# Patient Record
Sex: Male | Born: 1995 | Race: Black or African American | Hispanic: No | Marital: Single | Smoking: Current some day smoker
Health system: Southern US, Community
[De-identification: ages and names within clinical notes are randomized; demographics above are authoritative.]

## PROBLEM LIST (undated history)

## (undated) DIAGNOSIS — J45909 Unspecified asthma, uncomplicated: Secondary | ICD-10-CM

---

## 2015-05-04 ENCOUNTER — Encounter (HOSPITAL_COMMUNITY): Payer: Self-pay | Admitting: Oncology

## 2015-05-04 ENCOUNTER — Emergency Department (HOSPITAL_COMMUNITY)
Admission: EM | Admit: 2015-05-04 | Discharge: 2015-05-04 | Disposition: A | Discharge status: Home / Self Care | Payer: Self-pay | Attending: Emergency Medicine | Admitting: Emergency Medicine

## 2015-05-04 DIAGNOSIS — J069 Acute upper respiratory infection, unspecified: Secondary | ICD-10-CM | POA: Insufficient documentation

## 2015-05-04 DIAGNOSIS — J45901 Unspecified asthma with (acute) exacerbation: Secondary | ICD-10-CM | POA: Insufficient documentation

## 2015-05-04 DIAGNOSIS — Z8739 Personal history of other diseases of the musculoskeletal system and connective tissue: Secondary | ICD-10-CM | POA: Insufficient documentation

## 2015-05-04 DIAGNOSIS — F17200 Nicotine dependence, unspecified, uncomplicated: Secondary | ICD-10-CM | POA: Insufficient documentation

## 2015-05-04 MED ORDER — ALBUTEROL SULFATE HFA 108 (90 BASE) MCG/ACT IN AERS
4.0000 | INHALATION_SPRAY | Freq: Once | RESPIRATORY_TRACT | Status: AC
  Administered 2015-05-04: 4 via RESPIRATORY_TRACT
  Filled 2015-05-04: qty 6.7

## 2015-05-04 MED ORDER — ALBUTEROL SULFATE (2.5 MG/3ML) 0.083% IN NEBU
5.0000 mg | INHALATION_SOLUTION | Freq: Once | RESPIRATORY_TRACT | Status: AC
  Administered 2015-05-04: 5 mg via RESPIRATORY_TRACT
  Filled 2015-05-04: qty 6

## 2015-05-04 MED ORDER — PREDNISONE 20 MG PO TABS: 60.0000 mg | ORAL_TABLET | Freq: Every day | ORAL | Status: DC

## 2015-05-04 MED ORDER — PREDNISONE 20 MG PO TABS
60.0000 mg | ORAL_TABLET | Freq: Once | ORAL | Status: AC
  Administered 2015-05-04: 60 mg via ORAL
  Filled 2015-05-04: qty 3

## 2015-05-04 MED ORDER — ALBUTEROL SULFATE HFA 108 (90 BASE) MCG/ACT IN AERS
2.0000 | INHALATION_SPRAY | RESPIRATORY_TRACT | Status: AC | PRN
Start: 2015-05-04 — End: ?

## 2015-05-04 MED ORDER — BENZONATATE 100 MG PO CAPS: 100.0000 mg | ORAL_CAPSULE | Freq: Three times a day (TID) | ORAL | Status: DC | PRN

## 2015-05-04 NOTE — ED Provider Notes (Signed)
By signing my name below, I, Ronney LionSuzanne Le, attest that this documentation has been prepared under the direction and in the presence of Sherlyne Crownover N Novella Abraha, DO. Electronically Signed: Ronney LionSuzanne Le, ED Scribe. 05/04/2015. 4:39 AM.   TIME SEEN: 4:33 AM   CHIEF COMPLAINT: Wheezing  HPI:  Elby ShowersShyheim Cox is a 19 y.o. male with a history of asthma, who presents to the Emergency Department complaining of constant wheezing that began this morning. Patient has a history of asthma and reports he had caught a URI, with cough, rhinorrhea, and sore throat, which seemed to exacerbate his asthma. He states he had taken old prednisone pills yesterday from a prior prescription for his symptoms. Patient had a nebulizer treatment here which provided significant relief to his symptoms. He is unaware of any fever.    ROS: See HPI Constitutional: no fever  Eyes: no drainage  ENT: runny nose   Cardiovascular:  no chest pain  Resp: no SOB  GI: no vomiting GU: no dysuria Integumentary: no rash  Allergy: no hives  Musculoskeletal: no leg swelling  Neurological: no slurred speech ROS otherwise negative  PAST MEDICAL HISTORY/PAST SURGICAL HISTORY:  Past Medical History  Diagnosis Date  . Arthritis     MEDICATIONS:  Prior to Admission medications   Not on File    ALLERGIES:  No Known Allergies  SOCIAL HISTORY:  Social History  Substance Use Topics  . Smoking status: Current Some Day Smoker  . Smokeless tobacco: Never Used  . Alcohol Use: Yes    FAMILY HISTORY: No family history on file.  EXAM: BP 145/82 mmHg  Pulse 69  Temp(Src) 98 F (36.7 C) (Oral)  Resp 20  Ht 5\' 8"  (1.727 m)  Wt 150 lb (68.04 kg)  BMI 22.81 kg/m2  SpO2 99% CONSTITUTIONAL: Alert and oriented and responds appropriately to questions. Well-appearing; well-nourished, afebrile, smiling, joking, in no distress HEAD: Normocephalic EYES: Conjunctivae clear, PERRL ENT: normal nose; no rhinorrhea; moist mucous membranes; pharynx  without lesions noted NECK: Supple, no meningismus, no LAD  CARD: RRR; S1 and S2 appreciated; no murmurs, no clicks, no rubs, no gallops RESP: Normal chest excursion without splinting or tachypnea; breath sounds clear and equal bilaterally; no wheezes, no rhonchi, no rales, no hypoxia or respiratory distress, speaking full sentences ABD/GI: Normal bowel sounds; non-distended; soft, non-tender, no rebound, no guarding, no peritoneal signs BACK:  The back appears normal and is non-tender to palpation, there is no CVA tenderness EXT: Normal ROM in all joints; non-tender to palpation; no edema; normal capillary refill; no cyanosis, no calf tenderness or swelling    SKIN: Normal color for age and race; warm NEURO: Moves all extremities equally, sensation to light touch intact diffusely, cranial nerves II through XII intact PSYCH: The patient's mood and manner are appropriate. Grooming and personal hygiene are appropriate.  MEDICAL DECISION MAKING: Patient here with asthma exacerbation from URI. Patient's father has similar symptoms. He was given albuterol in triage and reports feeling much better and his lungs are clear. We'll discharge with prednisone burst and albuterol inhaler. Given he is not asymptomatic and do not feel he needs a chest x-ray. His lungs are clear and he has no fever. Doubt pneumonia. Suspect this is a viral illness exacerbating his asthma. Discussed return precautions. Patient verbalizes understanding and is comfortable with this plan.  I personally performed the services described in this documentation, which was scribed in my presence. The recorded information has been reviewed and is accurate.   Layla MawKristen N Zayanna Pundt,  DO 05/04/15 1610

## 2015-05-04 NOTE — ED Notes (Signed)
Prednisone and inhaler entered into the wrong pt's chart per Dr. Elesa MassedWard.  Dr. Elesa MassedWard asked this writer to d/c prednisone and inhaler on that pt and order it for this pt.  Pt had been d/c'd prior to discovering that order was on wrong pt.  Dale Cox d/c was undone in order to administer the prednisone and inhaler that had been meant for him.

## 2015-05-04 NOTE — ED Notes (Signed)
Pt c/o asthma exacerbation.  Recent contact w/ friend that has URI.  Pt is speaking in full sentences.  Used his rescue inhaler PTA however it did not help.

## 2015-05-04 NOTE — Discharge Instructions (Signed)
Asthma, Acute Bronchospasm  Acute bronchospasm caused by asthma is also referred to as an asthma attack. Bronchospasm means your air passages become narrowed. The narrowing is caused by inflammation and tightening of the muscles in the air tubes (bronchi) in your lungs. This can make it hard to breathe or cause you to wheeze and cough.  CAUSES  Possible triggers are:   Animal dander from the skin, hair, or feathers of animals.   Dust mites contained in house dust.   Cockroaches.   Pollen from trees or grass.   Mold.   Cigarette or tobacco smoke.   Air pollutants such as dust, household cleaners, hair sprays, aerosol sprays, paint fumes, strong chemicals, or strong odors.   Cold air or weather changes. Cold air may trigger inflammation. Winds increase molds and pollens in the air.   Strong emotions such as crying or laughing hard.   Stress.   Certain medicines such as aspirin or beta-blockers.   Sulfites in foods and drinks, such as dried fruits and wine.   Infections or inflammatory conditions, such as a flu, cold, or inflammation of the nasal membranes (rhinitis).   Gastroesophageal reflux disease (GERD). GERD is a condition where stomach acid backs up into your esophagus.   Exercise or strenuous activity.  SIGNS AND SYMPTOMS    Wheezing.   Excessive coughing, particularly at night.   Chest tightness.   Shortness of breath.  DIAGNOSIS   Your health care provider will ask you about your medical history and perform a physical exam. A chest X-ray or blood testing may be performed to look for other causes of your symptoms or other conditions that may have triggered your asthma attack.  TREATMENT   Treatment is aimed at reducing inflammation and opening up the airways in your lungs. Most asthma attacks are treated with inhaled medicines. These include quick relief or rescue medicines (such as bronchodilators) and controller medicines (such as inhaled corticosteroids). These medicines are sometimes  given through an inhaler or a nebulizer. Systemic steroid medicine taken by mouth or given through an IV tube also can be used to reduce the inflammation when an attack is moderate or severe. Antibiotic medicines are only used if a bacterial infection is present.   HOME CARE INSTRUCTIONS    Rest.   Drink plenty of liquids. This helps the mucus to remain thin and be easily coughed up. Only use caffeine in moderation and do not use alcohol until you have recovered from your illness.   Do not smoke. Avoid being exposed to secondhand smoke.   You play a critical role in keeping yourself in good health. Avoid exposure to things that cause you to wheeze or to have breathing problems.   Keep your medicines up-to-date and available. Carefully follow your health care provider's treatment plan.   Take your medicine exactly as prescribed.   When pollen or pollution is bad, keep windows closed and use an air conditioner or go to places with air conditioning.   Asthma requires careful medical care. See your health care provider for a follow-up as advised. If you are more than [redacted] weeks pregnant and you were prescribed any new medicines, let your obstetrician know about the visit and how you are doing. Follow up with your health care provider as directed.   After you have recovered from your asthma attack, make an appointment with your outpatient doctor to talk about ways to reduce the likelihood of future attacks. If you do not have a doctor   who manages your asthma, make an appointment with a primary care doctor to discuss your asthma.  SEEK IMMEDIATE MEDICAL CARE IF:    You are getting worse.   You have trouble breathing. If severe, call your local emergency services (911 in the [place]   You develop chest pain or discomfort.   You are vomiting.   You are not able to keep fluids down.   You are coughing up yellow, green, brown, or bloody sputum.   You have a fever and your symptoms suddenly get worse.   You have  trouble swallowing.  MAKE SURE YOU:    Understand these instructions.   Will watch your condition.   Will get help right away if you are not doing well or get worse.     This information is not intended to replace advice given to you by your health care provider. Make sure you discuss any questions you have with your health care provider.     Document Released: 08/10/2006 Document Revised: 04/30/2013 Document Reviewed: 10/31/2012  Elsevier Interactive Patient Education 2016 [employer]    Upper Respiratory Infection, Adult  Most upper respiratory infections (URIs) are a viral infection of the air passages leading to the lungs. A URI affects the nose, throat, and upper air passages. The most common type of URI is nasopharyngitis and is typically referred to as "the common cold."  URIs run their course and usually go away on their own. Most of the time, a URI does not require medical attention, but sometimes a bacterial infection in the upper airways can follow a viral infection. This is called a secondary infection. Sinus and middle ear infections are common types of secondary upper respiratory infections.  Bacterial pneumonia can also complicate a URI. A URI can worsen asthma and chronic obstructive pulmonary disease (COPD). Sometimes, these complications can require emergency medical care and may be life threatening.   CAUSES  Almost all URIs are caused by viruses. A virus is a type of germ and can spread from one person to another.   RISKS FACTORS  You may be at risk for a URI if:    You smoke.    You have chronic heart or lung disease.   You have a weakened defense (immune) system.    You are very young or very old.    You have nasal allergies or asthma.   You work in crowded or poorly ventilated areas.   You work in health care facilities or schools.  SIGNS AND SYMPTOMS   Symptoms typically develop 2-3 days after you come in contact with a cold virus. Most viral URIs last 7-10 days. However,  viral URIs from the influenza virus (flu virus) can last 14-18 days and are typically more severe. Symptoms may include:    Runny or stuffy (congested) nose.    Sneezing.    Cough.    Sore throat.    Headache.    Fatigue.    Fever.    Loss of appetite.    Pain in your forehead, behind your eyes, and over your cheekbones (sinus pain).   Muscle aches.   DIAGNOSIS   Your health care provider may diagnose a URI by:   Physical exam.   Tests to check that your symptoms are not due to another condition such as:   Strep throat.   Sinusitis.   Pneumonia.   Asthma.  TREATMENT   A URI goes away on its own with time. It cannot be cured   with medicines, but medicines may be prescribed or recommended to relieve symptoms. Medicines may help:   Reduce your fever.   Reduce your cough.   Relieve nasal congestion.  HOME CARE INSTRUCTIONS    Take medicines only as directed by your health care provider.    Gargle warm saltwater or take cough drops to comfort your throat as directed by your health care provider.   Use a warm mist humidifier or inhale steam from a shower to increase air moisture. This may make it easier to breathe.   Drink enough fluid to keep your urine clear or pale yellow.    Eat soups and other clear broths and maintain good nutrition.    Rest as needed.    Return to work when your temperature has returned to normal or as your health care provider advises. You may need to stay home longer to avoid infecting others. You can also use a face mask and careful hand washing to prevent spread of the virus.   Increase the usage of your inhaler if you have asthma.    Do not use any tobacco products, including cigarettes, chewing tobacco, or electronic cigarettes. If you need help quitting, ask your health care provider.  PREVENTION   The best way to protect yourself from getting a cold is to practice good hygiene.    Avoid oral or hand contact with people with cold symptoms.    Wash  your hands often if contact occurs.   There is no clear evidence that vitamin C, vitamin E, echinacea, or exercise reduces the chance of developing a cold. However, it is always recommended to get plenty of rest, exercise, and practice good nutrition.   SEEK MEDICAL CARE IF:    You are getting worse rather than better.    Your symptoms are not controlled by medicine.    You have chills.   You have worsening shortness of breath.   You have brown or red mucus.   You have yellow or brown nasal discharge.   You have pain in your face, especially when you bend forward.   You have a fever.   You have swollen neck glands.   You have pain while swallowing.   You have white areas in the back of your throat.  SEEK IMMEDIATE MEDICAL CARE IF:    You have severe or persistent:    Headache.    Ear pain.    Sinus pain.    Chest pain.   You have chronic lung disease and any of the following:    Wheezing.    Prolonged cough.    Coughing up blood.    A change in your usual mucus.   You have a stiff neck.   You have changes in your:    Vision.    Hearing.    Thinking.    Mood.  MAKE SURE YOU:    Understand these instructions.   Will watch your condition.   Will get help right away if you are not doing well or get worse.     This information is not intended to replace advice given to you by your health care provider. Make sure you discuss any questions you have with your health care provider.     Document Released: 10/19/2000 Document Revised: 09/09/2014 Document Reviewed: 07/31/2013  Elsevier Interactive Patient Education 2016 [employer] Inc[employer]

## 2015-06-18 ENCOUNTER — Emergency Department (HOSPITAL_COMMUNITY)
Admission: EM | Admit: 2015-06-18 | Discharge: 2015-06-18 | Disposition: A | Discharge status: Home / Self Care | Payer: Self-pay | Attending: Emergency Medicine | Admitting: Emergency Medicine

## 2015-06-18 ENCOUNTER — Encounter (HOSPITAL_COMMUNITY): Payer: Self-pay | Admitting: Emergency Medicine

## 2015-06-18 ENCOUNTER — Emergency Department (HOSPITAL_COMMUNITY): Payer: Self-pay

## 2015-06-18 DIAGNOSIS — R091 Pleurisy: Secondary | ICD-10-CM | POA: Insufficient documentation

## 2015-06-18 DIAGNOSIS — J45901 Unspecified asthma with (acute) exacerbation: Secondary | ICD-10-CM | POA: Insufficient documentation

## 2015-06-18 DIAGNOSIS — Z79899 Other long term (current) drug therapy: Secondary | ICD-10-CM | POA: Insufficient documentation

## 2015-06-18 DIAGNOSIS — F17200 Nicotine dependence, unspecified, uncomplicated: Secondary | ICD-10-CM | POA: Insufficient documentation

## 2015-06-18 HISTORY — DX: Unspecified asthma, uncomplicated: J45.909

## 2015-06-18 MED ORDER — IBUPROFEN 800 MG PO TABS
800.0000 mg | ORAL_TABLET | Freq: Once | ORAL | Status: AC
  Administered 2015-06-18: 800 mg via ORAL
  Filled 2015-06-18: qty 1

## 2015-06-18 MED ORDER — IBUPROFEN 800 MG PO TABS: 800.0000 mg | ORAL_TABLET | Freq: Three times a day (TID) | ORAL | Status: DC | PRN

## 2015-06-18 NOTE — Discharge Instructions (Signed)
Pleurisy  Pleurisy is an inflammation and swelling of the lining of the lungs (pleura). Because of this inflammation, it hurts to breathe. It can be aggravated by coughing, laughing, or deep breathing. Pleurisy is often caused by an underlying infection or disease.   HOME CARE INSTRUCTIONS   Monitor your pleurisy for any changes. The following actions may help to alleviate any discomfort you are experiencing:  · Medicine may help with pain. Only take over-the-counter or prescription medicines for pain, discomfort, or fever as directed by your health care provider.  · Only take antibiotic medicine as directed. Make sure to finish it even if you start to feel better.  SEEK MEDICAL CARE IF:   · Your pain is not controlled with medicine or is increasing.  · You have an increase in pus-like (purulent) secretions brought up with coughing.  SEEK IMMEDIATE MEDICAL CARE IF:   · You have blue or dark lips, fingernails, or toenails.  · You are coughing up blood.  · You have increased difficulty breathing.  · You have continuing pain unrelieved by medicine or pain lasting more than 1 week.  · You have pain that radiates into your neck, arms, or jaw.  · You develop increased shortness of breath or wheezing.  · You develop a fever, rash, vomiting, fainting, or other serious symptoms.  MAKE SURE YOU:  · Understand these instructions.    · Will watch your condition.    · Will get help right away if you are not doing well or get worse.        This information is not intended to replace advice given to you by your health care provider. Make sure you discuss any questions you have with your health care provider.     Document Released: 04/25/2005 Document Revised: 12/26/2012 Document Reviewed: 10/07/2012  Elsevier Interactive Patient Education ©2016 [employer]

## 2015-06-18 NOTE — ED Provider Notes (Signed)
CSN: [id number]     Arrival date & time 06/18/15  1521 History   First MD Initiated Contact with Patient 06/18/15 1543     CC: chest discomfort   HPI Pt was cooking last night in the house and the food got overcooked and the room became smoky.  He started to become short of breath and had to use his inhaler.  He started having trouble with chest tightness and discomfort.  Mostly on the left side and it increases with breathing. Slight coughing.  He has been wheezing a little bit.  No fevers.  No swelling.  No hx of DVT , PE or cardiac disease Past Medical History  Diagnosis Date  . Asthma    History reviewed. No pertinent past surgical history. History reviewed. No pertinent family history. Social History  Substance Use Topics  . Smoking status: Current Some Day Smoker  . Smokeless tobacco: Never Used  . Alcohol Use: Yes    Review of Systems  All other systems reviewed and are negative.     Allergies  Review of patient's allergies indicates no known allergies.  Home Medications   Prior to Admission medications   Medication Sig Start Date End Date Taking? Authorizing Provider  albuterol (PROVENTIL HFA;VENTOLIN HFA) 108 (90 BASE) MCG/ACT inhaler Inhale 2 puffs into the lungs every 4 (four) hours as needed for wheezing or shortness of breath. 05/04/15  Yes Kristen N Ward, DO  benzonatate (TESSALON) 100 MG capsule Take 1 capsule (100 mg total) by mouth 3 (three) times daily as needed for cough. Patient not taking: Reported on 06/18/2015 05/04/15   Kristen N Ward, DO  ibuprofen (ADVIL,MOTRIN) 800 MG tablet Take 1 tablet (800 mg total) by mouth every 8 (eight) hours as needed. 06/18/15   Linwood Dibbles, MD  predniSONE (DELTASONE) 20 MG tablet Take 3 tablets (60 mg total) by mouth daily. Patient not taking: Reported on 06/18/2015 05/04/15   Kristen N Ward, DO   BP 123/67 mmHg  Pulse 75  Temp(Src) 98.2 F (36.8 C) (Oral)  Resp 17  SpO2 100% Physical Exam  Constitutional: He appears  well-developed and well-nourished. No distress.  HENT:  Head: Normocephalic and atraumatic.  Right Ear: External ear normal.  Left Ear: External ear normal.  Eyes: Conjunctivae are normal. Right eye exhibits no discharge. Left eye exhibits no discharge. No scleral icterus.  Neck: Neck supple. No tracheal deviation present.  Cardiovascular: Normal rate, regular rhythm and intact distal pulses.   Pulmonary/Chest: Effort normal and breath sounds normal. No stridor. No respiratory distress. He has no wheezes. He has no rales.  Abdominal: Soft. Bowel sounds are normal. He exhibits no distension. There is no tenderness. There is no rebound and no guarding.  Musculoskeletal: He exhibits no edema or tenderness.  Neurological: He is alert. He has normal strength. No cranial nerve deficit (no facial droop, extraocular movements intact, no slurred speech) or sensory deficit. He exhibits normal muscle tone. He displays no seizure activity. Coordination normal.  Skin: Skin is warm and dry. No rash noted.  Psychiatric: He has a normal mood and affect.  Nursing note and vitals reviewed.   ED Course  Procedures (including critical care time) Labs Review Labs Reviewed - No data to display  Imaging Review Dg Chest 2 View  06/18/2015  CLINICAL DATA:  Pt has left sided chest pressure after being exposed to smoke last night. Patient has asthma hx. EXAM: CHEST  2 VIEW COMPARISON:  None. FINDINGS: The heart size and mediastinal  contours are within normal limits. Both lungs are clear. The visualized skeletal structures are unremarkable. IMPRESSION: No active cardiopulmonary disease. Electronically Signed   By: Norva Pavlov M.D.   On: 06/18/2015 16:15   I have personally reviewed and evaluated these images and lab results as part of my medical decision-making.  EKG Normal sinus rhythm rate 55 Normal access, normal intervals Early repolarization pattern No prior EKG for comparison  MDM   Final  diagnoses:  Pleurisy   Doubt ACS or PE.  No wheezing on exam.  Sx may be related to pleurisy.  At this time there does not appear to be any evidence of an acute emergency medical condition and the patient appears stable for discharge with appropriate outpatient follow up.      Linwood Dibbles, MD 06/18/15 [phone]

## 2015-06-18 NOTE — ED Notes (Signed)
Patient presents to ED with left sided chest pressure after being exposed to smoke last night. Patient has asthma hx.

## 2015-07-14 ENCOUNTER — Emergency Department (HOSPITAL_COMMUNITY): Payer: Self-pay

## 2015-07-14 ENCOUNTER — Emergency Department (HOSPITAL_COMMUNITY)
Admission: EM | Admit: 2015-07-14 | Discharge: 2015-07-14 | Disposition: A | Discharge status: Home / Self Care | Payer: Self-pay | Attending: Emergency Medicine | Admitting: Emergency Medicine

## 2015-07-14 ENCOUNTER — Encounter (HOSPITAL_COMMUNITY): Payer: Self-pay

## 2015-07-14 DIAGNOSIS — F17200 Nicotine dependence, unspecified, uncomplicated: Secondary | ICD-10-CM | POA: Insufficient documentation

## 2015-07-14 DIAGNOSIS — J069 Acute upper respiratory infection, unspecified: Secondary | ICD-10-CM | POA: Insufficient documentation

## 2015-07-14 DIAGNOSIS — M549 Dorsalgia, unspecified: Secondary | ICD-10-CM | POA: Insufficient documentation

## 2015-07-14 DIAGNOSIS — R112 Nausea with vomiting, unspecified: Secondary | ICD-10-CM | POA: Insufficient documentation

## 2015-07-14 DIAGNOSIS — R630 Anorexia: Secondary | ICD-10-CM | POA: Insufficient documentation

## 2015-07-14 DIAGNOSIS — R531 Weakness: Secondary | ICD-10-CM | POA: Insufficient documentation

## 2015-07-14 DIAGNOSIS — R5383 Other fatigue: Secondary | ICD-10-CM | POA: Insufficient documentation

## 2015-07-14 DIAGNOSIS — J45901 Unspecified asthma with (acute) exacerbation: Secondary | ICD-10-CM | POA: Insufficient documentation

## 2015-07-14 DIAGNOSIS — Z79899 Other long term (current) drug therapy: Secondary | ICD-10-CM | POA: Insufficient documentation

## 2015-07-14 LAB — RAPID STREP SCREEN (MED CTR MEBANE ONLY): Streptococcus, Group A Screen (Direct): NEGATIVE

## 2015-07-14 MED ORDER — ALBUTEROL SULFATE (2.5 MG/3ML) 0.083% IN NEBU
5.0000 mg | INHALATION_SOLUTION | Freq: Once | RESPIRATORY_TRACT | Status: AC
  Administered 2015-07-14: 5 mg via RESPIRATORY_TRACT
  Filled 2015-07-14: qty 6

## 2015-07-14 NOTE — Progress Notes (Signed)
Entered in d/c instructions  Please use the resources provided to you in emergency room by case manager to assist with doctor for follow up If immediate follow up services is needed Please call one of the doctors listed on the first two pages Please speak with the billing offices of your referral specialists for possible payment plans [place] provide possible primary care providers, resources for discounted medications, housing, dental resources, affordable care act information, plus other resources for [place] [facility] [place]

## 2015-07-14 NOTE — Discharge Instructions (Signed)
Use your inhaler as needed if you notice wheezing. Return to ED with shortness of breath or wheezing that does not resolve with your inhaler.    Upper Respiratory Infection, Adult Most upper respiratory infections (URIs) are a viral infection of the air passages leading to the lungs. A URI affects the nose, throat, and upper air passages. The most common type of URI is nasopharyngitis and is typically referred to as "the common cold." URIs run their course and usually go away on their own. Most of the time, a URI does not require medical attention, but sometimes a bacterial infection in the upper airways can follow a viral infection. This is called a secondary infection. Sinus and middle ear infections are common types of secondary upper respiratory infections. Bacterial pneumonia can also complicate a URI. A URI can worsen asthma and chronic obstructive pulmonary disease (COPD). Sometimes, these complications can require emergency medical care and may be life threatening.  CAUSES Almost all URIs are caused by viruses. A virus is a type of germ and can spread from one person to another.  RISKS FACTORS You may be at risk for a URI if:   You smoke.   You have chronic heart or lung disease.  You have a weakened defense (immune) system.   You are very young or very old.   You have nasal allergies or asthma.  You work in crowded or poorly ventilated areas.  You work in health care facilities or schools. SIGNS AND SYMPTOMS  Symptoms typically develop 2-3 days after you come in contact with a cold virus. Most viral URIs last 7-10 days. However, viral URIs from the influenza virus (flu virus) can last 14-18 days and are typically more severe. Symptoms may include:   Runny or stuffy (congested) nose.   Sneezing.   Cough.   Sore throat.   Headache.   Fatigue.   Fever.   Loss of appetite.   Pain in your forehead, behind your eyes, and over your cheekbones (sinus  pain).  Muscle aches.  DIAGNOSIS  Your health care provider may diagnose a URI by:  Physical exam.  Tests to check that your symptoms are not due to another condition such as:  Strep throat.  Sinusitis.  Pneumonia.  Asthma. TREATMENT  A URI goes away on its own with time. It cannot be cured with medicines, but medicines may be prescribed or recommended to relieve symptoms. Medicines may help:  Reduce your fever.  Reduce your cough.  Relieve nasal congestion. HOME CARE INSTRUCTIONS   Take medicines only as directed by your health care provider.   Gargle warm saltwater or take cough drops to comfort your throat as directed by your health care provider.  Use a warm mist humidifier or inhale steam from a shower to increase air moisture. This may make it easier to breathe.  Drink enough fluid to keep your urine clear or pale yellow.   Eat soups and other clear broths and maintain good nutrition.   Rest as needed.   Return to work when your temperature has returned to normal or as your health care provider advises. You may need to stay home longer to avoid infecting others. You can also use a face mask and careful hand washing to prevent spread of the virus.  Increase the usage of your inhaler if you have asthma.   Do not use any tobacco products, including cigarettes, chewing tobacco, or electronic cigarettes. If you need help quitting, ask your health care provider.  PREVENTION  The best way to protect yourself from getting a cold is to practice good hygiene.   Avoid oral or hand contact with people with cold symptoms.   Wash your hands often if contact occurs.  There is no clear evidence that vitamin C, vitamin E, echinacea, or exercise reduces the chance of developing a cold. However, it is always recommended to get plenty of rest, exercise, and practice good nutrition.  SEEK MEDICAL CARE IF:   You are getting worse rather than better.   Your symptoms are  not controlled by medicine.   You have chills.  You have worsening shortness of breath.  You have brown or red mucus.  You have yellow or brown nasal discharge.  You have pain in your face, especially when you bend forward.  You have a fever.  You have swollen neck glands.  You have pain while swallowing.  You have white areas in the back of your throat. SEEK IMMEDIATE MEDICAL CARE IF:   You have severe or persistent:  Headache.  Ear pain.  Sinus pain.  Chest pain.  You have chronic lung disease and any of the following:  Wheezing.  Prolonged cough.  Coughing up blood.  A change in your usual mucus.  You have a stiff neck.  You have changes in your:  Vision.  Hearing.  Thinking.  Mood. MAKE SURE YOU:   Understand these instructions.  Will watch your condition.  Will get help right away if you are not doing well or get worse.   This information is not intended to replace advice given to you by your health care provider. Make sure you discuss any questions you have with your health care provider.   Document Released: 10/19/2000 Document Revised: 09/09/2014 Document Reviewed: 07/31/2013 Elsevier Interactive Patient Education [employer]2016 Elsevier Inc.

## 2015-07-14 NOTE — ED Notes (Addendum)
Pt here with headache and chills starting yesterday.  Today with body pain, emesis, and symptoms worsened.  Pt is also asthmatic.  States with his hand held neb he is still noticing some shortness of breath with his coughing.  Pt in no distress in triage.

## 2015-07-14 NOTE — ED Provider Notes (Signed)
CSN: [id number]     Arrival date & time 07/14/15  1439 History   By signing my name below, I, Arlan Organshley Leger, attest that this documentation has been prepared under the direction and in the presence of Vaughn Beaumier PA-C.  Electronically Signed: Arlan OrganAshley Leger, ED Scribe. 07/14/2015. 5:21 PM.   Chief Complaint  Patient presents with  . Headache  . Shortness of Breath   The history is provided by the patient. No language interpreter was used.    HPI Comments: Elby ShowersShyheim Vespa is a 20 y.o. male with a PMHx of asthma who presents to the Emergency Department complaining of constant, ongoing myalgias x 1 day. He states that he aches all over his head, back, arms and legs. Pt also reports fatigue, nausea, vomiting yesterday, generalized weakness, chills, productive cough consisting of green/yellow/clear mucous and decreased appetite. He denies current chest tightness, wheezing or shortness of breath but states that he took his inhaler anyway without improvement. He states that occasionally he coughs so hard that he gets SOB but this resolves once coughing resolves. No aggravating or alleviating factors at this time. Pt did not receive his Flu vaccine this season. No known sick contacts. Denies fevers, dizziness, syncope, ear pain, congestion, rhinorrhea, sore throat, neck pain, chest pain, abdominal pain, diarrhea or rashes. No known allergies to medications.  PCP: No primary care provider on file.    Past Medical History  Diagnosis Date  . Asthma    History reviewed. No pertinent past surgical history. History reviewed. No pertinent family history. Social History  Substance Use Topics  . Smoking status: Current Some Day Smoker  . Smokeless tobacco: Never Used  . Alcohol Use: Yes    Review of Systems  Constitutional: Positive for chills, appetite change and fatigue.  HENT: Negative for congestion, rhinorrhea and sore throat.   Eyes: Negative for visual disturbance.  Respiratory: Positive for  cough and shortness of breath (with coughing).   Cardiovascular: Negative for chest pain.  Gastrointestinal: Positive for nausea and vomiting. Negative for abdominal pain and diarrhea.  Musculoskeletal: Positive for myalgias and back pain. Negative for neck pain and neck stiffness.  Skin: Negative for rash.  Neurological: Positive for weakness and headaches. Negative for dizziness and syncope.  All other systems reviewed and are negative.     Allergies  Review of patient's allergies indicates no known allergies.  Home Medications   Prior to Admission medications   Medication Sig Start Date End Date Taking? Authorizing Provider  albuterol (PROVENTIL HFA;VENTOLIN HFA) 108 (90 BASE) MCG/ACT inhaler Inhale 2 puffs into the lungs every 4 (four) hours as needed for wheezing or shortness of breath. 05/04/15   Kristen N Ward, DO  benzonatate (TESSALON) 100 MG capsule Take 1 capsule (100 mg total) by mouth 3 (three) times daily as needed for cough. Patient not taking: Reported on 06/18/2015 05/04/15   Kristen N Ward, DO  ibuprofen (ADVIL,MOTRIN) 800 MG tablet Take 1 tablet (800 mg total) by mouth every 8 (eight) hours as needed. 06/18/15   Linwood DibblesJon Knapp, MD  predniSONE (DELTASONE) 20 MG tablet Take 3 tablets (60 mg total) by mouth daily. Patient not taking: Reported on 06/18/2015 05/04/15   Layla MawKristen N Ward, DO   Triage Vitals: BP 139/80 mmHg  Pulse 98  Temp(Src) 99.5 F (37.5 C) (Oral)  Resp 20  SpO2 100%   Physical Exam  Constitutional: He appears well-developed and well-nourished. No distress.  Nontoxic appearing, no acute distress  HENT:  Head: Normocephalic and atraumatic.  Mouth/Throat: Oropharynx is clear and moist. No oropharyngeal exudate.  Eyes: Conjunctivae and EOM are normal. Right eye exhibits no discharge. Left eye exhibits no discharge. No scleral icterus.  Neck: Normal range of motion. Neck supple.  No rigidity  Cardiovascular: Normal rate, regular rhythm and normal heart sounds.    Pulmonary/Chest: Effort normal and breath sounds normal. No respiratory distress. He has no wheezes.  Breathing unlabored. No respiratory distress. Lungs CTAB with good air movement in all fields. No wheezing. O2 remains 100%  Musculoskeletal: Normal range of motion.  Lymphadenopathy:    He has no cervical adenopathy.  Neurological: He is alert. Coordination normal.  Skin: Skin is warm and dry.  Psychiatric: He has a normal mood and affect. His behavior is normal.  Nursing note and vitals reviewed.   ED Course  Procedures (including critical care time)  DIAGNOSTIC STUDIES: Oxygen Saturation is 97% on RA, adequate by my interpretation.    COORDINATION OF CARE: 5:16 PM- Will give breathing treatment. Discussed treatment plan with pt at bedside and pt agreed to plan.     Labs Review Labs Reviewed  RAPID STREP SCREEN (NOT AT [facility])  CULTURE, GROUP A STREP [facility])    Imaging Review Dg Chest 2 View  07/14/2015  CLINICAL DATA:  Asthma, shortness of breath EXAM: CHEST  2 VIEW COMPARISON:  06/18/2015 FINDINGS: The heart size and mediastinal contours are within normal limits. Both lungs are clear. The visualized skeletal structures are unremarkable. IMPRESSION: No active cardiopulmonary disease. Electronically Signed   By: Elige Ko   On: 07/14/2015 18:08   I have personally reviewed and evaluated these images and lab results as part of my medical decision-making.   EKG Interpretation None      MDM   Final diagnoses:  URI (upper respiratory infection)   Patient presenting with chills, myalgias, cough, fatigue and nausea x 2 days. VSS. Pt is nontoxic appearing. No nasal musosal edema noted. TMs pearly gray without erythema or effusion. Oropharynx without erythema, edema or exudate. Lungs CTAB. CXR negative for acute infiltrate. Rapid strep negative. No wheezing or SOB; doubt current asthma exacerbation. Patients symptoms are consistent with viral illness. Discussed that antibiotics  are not indicated for viral infections. Pt will be discharged with symptomatic treatment. Verbalizes understanding and is agreeable with plan. Pt is hemodynamically stable & in NAD prior to dc. I have also discussed reasons to return immediately to the ER. Return precautions given in discharge paperwork and discussed with pt at bedside. Pt stable for discharge  I personally performed the services described in this documentation, which was scribed in my presence. The recorded information has been reviewed and is accurate.   Rolm Gala Neftaly Inzunza, PA-C 07/14/15 1855  Tilden Fossa, MD 07/17/15 1028

## 2015-07-14 NOTE — Progress Notes (Signed)
CM spoke with pt who [place]cp.  CM discussed and provided written information for uninsured accepting pcps, discussed the importance of pcp vs EDP services for f/u care, www.needymeds.org, www.goodrx.com, discounted pharmacies and other [place] resources such as [facility] , [facility], affordable care act, financial assistance, uninsured dental services, [place] med assist, DSS and  health department  Reviewed resources for [place] uninsured accepting pcps like [facility], family medicine [facility] [address], [facility], [facility], local urgent care centers, [facility], [facility], general medical clinics, [facility] the [place], [facility] urgent care plus others, medication resources, [facility] out patient pharmacies and housing Pt voiced understanding and appreciation of resources provided   Provided [facility] contact information Pt is not eligible for services from [facility] He has [place] been in [place] Pt states he was on his [employer] insurance coverage but at this time is not sure what is available for him He has not consulted mother [place] CM discussed him consulted [facility] after 3 months

## 2015-07-17 LAB — CULTURE, GROUP A STREP (THRC)

## 2015-10-16 ENCOUNTER — Encounter (HOSPITAL_COMMUNITY): Payer: Self-pay | Admitting: Emergency Medicine

## 2015-10-16 ENCOUNTER — Emergency Department (HOSPITAL_COMMUNITY)
Admission: EM | Admit: 2015-10-16 | Discharge: 2015-10-16 | Disposition: A | Discharge status: Home / Self Care | Payer: Self-pay | Attending: Emergency Medicine | Admitting: Emergency Medicine

## 2015-10-16 DIAGNOSIS — J45909 Unspecified asthma, uncomplicated: Secondary | ICD-10-CM | POA: Insufficient documentation

## 2015-10-16 DIAGNOSIS — F17200 Nicotine dependence, unspecified, uncomplicated: Secondary | ICD-10-CM | POA: Insufficient documentation

## 2015-10-16 DIAGNOSIS — Z113 Encounter for screening for infections with a predominantly sexual mode of transmission: Secondary | ICD-10-CM | POA: Insufficient documentation

## 2015-10-16 DIAGNOSIS — Z202 Contact with and (suspected) exposure to infections with a predominantly sexual mode of transmission: Secondary | ICD-10-CM

## 2015-10-16 NOTE — ED Provider Notes (Signed)
CSN: [id number]     Arrival date & time 10/16/15  [id number] History   First MD Initiated Contact with Patient 10/16/15 0920     Chief Complaint  Patient presents with  . STD check      (Consider location/radiation/quality/duration/timing/severity/associated sxs/prior Treatment) HPI Comments: Patient is a 20 year old male with no significant past medical history. He presents for evaluation of possible STD. He reports that he had a new sex partner recently and his girlfriend wanted him to be checked for STDs. The patient has no symptoms. He denies any dysuria, discharge, rash, abdominal pain, or other complaints.  The history is provided by the patient.    Past Medical History  Diagnosis Date  . Asthma    History reviewed. No pertinent past surgical history. No family history on file. Social History  Substance Use Topics  . Smoking status: Current Some Day Smoker  . Smokeless tobacco: Never Used  . Alcohol Use: Yes    Review of Systems  All other systems reviewed and are negative.     Allergies  Review of patient's allergies indicates no known allergies.  Home Medications   Prior to Admission medications   Medication Sig Start Date End Date Taking? Authorizing Provider  albuterol (PROVENTIL HFA;VENTOLIN HFA) 108 (90 BASE) MCG/ACT inhaler Inhale 2 puffs into the lungs every 4 (four) hours as needed for wheezing or shortness of breath. 05/04/15   Kristen N Ward, DO  benzonatate (TESSALON) 100 MG capsule Take 1 capsule (100 mg total) by mouth 3 (three) times daily as needed for cough. Patient not taking: Reported on 06/18/2015 05/04/15   Kristen N Ward, DO  ibuprofen (ADVIL,MOTRIN) 800 MG tablet Take 1 tablet (800 mg total) by mouth every 8 (eight) hours as needed. 06/18/15   Linwood DibblesJon Knapp, MD  predniSONE (DELTASONE) 20 MG tablet Take 3 tablets (60 mg total) by mouth daily. Patient not taking: Reported on 06/18/2015 05/04/15   Kristen N Ward, DO   BP 130/82 mmHg  Pulse 63  Temp(Src) 97.8  F (36.6 C) (Oral)  Resp 16  Ht 5\' 8"  (1.727 m)  Wt 145 lb (65.772 kg)  BMI 22.05 kg/m2  SpO2 100% Physical Exam  Constitutional: He is oriented to person, place, and time. He appears well-developed and well-nourished. No distress.  HENT:  Head: Normocephalic and atraumatic.  Neck: Normal range of motion. Neck supple.  Genitourinary: Penis normal.  There is no urethral discharge or genital rash.  Neurological: He is alert and oriented to person, place, and time.  Skin: Skin is warm and dry. He is not diaphoretic.  Nursing note and vitals reviewed.   ED Course  Procedures (including critical care time) Labs Review Labs Reviewed  GC/CHLAMYDIA PROBE AMP ([place]) NOT AT [facility]    Imaging Review No results found. I have personally reviewed and evaluated these images and lab results as part of my medical decision-making.   EKG Interpretation None      MDM   Final diagnoses:  None    Patient here at the request of his girlfriend to have STD testing. The patient was unfaithful with a new partner. None of his sexual contacts and reported a positive STD test or are having symptoms. I have performed a GC/chlamydia swab. The results of this test are pending. As the patient has no symptoms, he will not be treated presumptively unless his culture turns positive.    Geoffery Lyonsouglas Johnn Krasowski, MD 10/16/15 [phone]

## 2015-10-16 NOTE — ED Notes (Signed)
MD at bedside. 

## 2015-10-16 NOTE — Discharge Instructions (Signed)
We will call you if your cultures indicate you require further treatment. ° ° °Sexually Transmitted Disease °A sexually transmitted disease (STD) is a disease or infection that may be passed (transmitted) from person to person, usually during sexual activity. This may happen by way of saliva, semen, blood, vaginal mucus, or urine. Common STDs include: °· Gonorrhea. °· Chlamydia. °· Syphilis. °· HIV and AIDS. °· Genital herpes. °· Hepatitis B and C. °· Trichomonas. °· Human papillomavirus (HPV). °· Pubic lice. °· Scabies. °· Mites. °· Bacterial vaginosis. °WHAT ARE CAUSES OF STDs? °An STD may be caused by bacteria, a virus, or parasites. STDs are often transmitted during sexual activity if one person is infected. However, they may also be transmitted through nonsexual means. STDs may be transmitted after:  °· Sexual intercourse with an infected person. °· Sharing sex toys with an infected person. °· Sharing needles with an infected person or using unclean piercing or tattoo needles. °· Having intimate contact with the genitals, mouth, or rectal areas of an infected person. °· Exposure to infected fluids during birth. °WHAT ARE THE SIGNS AND SYMPTOMS OF STDs? °Different STDs have different symptoms. Some people may not have any symptoms. If symptoms are present, they may include: °· Painful or bloody urination. °· Pain in the pelvis, abdomen, vagina, anus, throat, or eyes. °· A skin rash, itching, or irritation. °· Growths, ulcerations, blisters, or sores in the genital and anal areas. °· Abnormal vaginal discharge with or without bad odor. °· Penile discharge in men. °· Fever. °· Pain or bleeding during sexual intercourse. °· Swollen glands in the groin area. °· Yellow skin and eyes (jaundice). This is seen with hepatitis. °· Swollen testicles. °· Infertility. °· Sores and blisters in the mouth. °HOW ARE STDs DIAGNOSED? °To make a diagnosis, your health care provider may: °· Take a medical history. °· Perform a  physical exam. °· Take a sample of any discharge to examine. °· Swab the throat, cervix, opening to the penis, rectum, or vagina for testing. °· Test a sample of your first morning urine. °· Perform blood tests. °· Perform a Pap test, if this applies. °· Perform a colposcopy. °· Perform a laparoscopy. °HOW ARE STDs TREATED? °Treatment depends on the STD. Some STDs may be treated but not cured. °· Chlamydia, gonorrhea, trichomonas, and syphilis can be cured with antibiotic medicine. °· Genital herpes, hepatitis, and HIV can be treated, but not cured, with prescribed medicines. The medicines lessen symptoms. °· Genital warts from HPV can be treated with medicine or by freezing, burning (electrocautery), or surgery. Warts may come back. °· HPV cannot be cured with medicine or surgery. However, abnormal areas may be removed from the cervix, vagina, or vulva. °· If your diagnosis is confirmed, your recent sexual partners need treatment. This is true even if they are symptom-free or have a negative culture or evaluation. They should not have sex until their health care providers say it is okay. °· Your health care provider may test you for infection again 3 months after treatment. °HOW CAN I REDUCE MY RISK OF GETTING AN STD? °Take these steps to reduce your risk of getting an STD: °· Use latex condoms, dental dams, and water-soluble lubricants during sexual activity. Do not use petroleum jelly or oils. °· Avoid having multiple sex partners. °· Do not have sex with someone who has other sex partners °· Do not have sex with anyone you do not know or who is at high risk for an STD. °·   Avoid risky sex practices that can break your skin.  Do not have sex if you have open sores on your mouth or skin.  Avoid drinking too much alcohol or taking illegal drugs. Alcohol and drugs can affect your judgment and put you in a vulnerable position.  Avoid engaging in oral and anal sex acts.  Get vaccinated for HPV and hepatitis. If  you have not received these vaccines in the past, talk to your health care provider about whether one or both might be right for you.  If you are at risk of being infected with HIV, it is recommended that you take a prescription medicine daily to prevent HIV infection. This is called pre-exposure prophylaxis (PrEP). You are considered at risk if:  You are a man who has sex with other men (MSM).  You are a heterosexual man or woman and are sexually active with more than one partner.  You take drugs by injection.  You are sexually active with a partner who has HIV.  Talk with your health care provider about whether you are at high risk of being infected with HIV. If you choose to begin PrEP, you should first be tested for HIV. You should then be tested every 3 months for as long as you are taking PrEP. WHAT SHOULD I DO IF I THINK I HAVE AN STD?  See your health care provider.  Tell your sexual partner(s). They should be tested and treated for any STDs.  Do not have sex until your health care provider says it is okay. WHEN SHOULD I GET IMMEDIATE MEDICAL CARE? Contact your health care provider right away if:   You have severe abdominal pain.  You are a man and notice swelling or pain in your testicles.  You are a woman and notice swelling or pain in your vagina.   This information is not intended to replace advice given to you by your health care provider. Make sure you discuss any questions you have with your health care provider.   Document Released: 07/16/2002 Document Revised: 05/16/2014 Document Reviewed: 11/13/2012 Elsevier Interactive Patient Education [employer].

## 2015-10-16 NOTE — ED Notes (Signed)
Discharge instructions and follow up care reviewed with patient. Patient verbalized understanding. 

## 2015-10-16 NOTE — ED Notes (Signed)
Patient "switched up sex partners" and patient wants to be tested to STDs.

## 2015-10-19 LAB — GC/CHLAMYDIA PROBE AMP (~~LOC~~) NOT AT ARMC
Chlamydia: NEGATIVE
Neisseria Gonorrhea: NEGATIVE

## 2016-06-26 ENCOUNTER — Emergency Department (HOSPITAL_COMMUNITY)
Admission: EM | Admit: 2016-06-26 | Discharge: 2016-06-26 | Disposition: A | Discharge status: Home / Self Care | Payer: Self-pay | Attending: Emergency Medicine | Admitting: Emergency Medicine

## 2016-06-26 ENCOUNTER — Emergency Department (HOSPITAL_COMMUNITY): Payer: Self-pay

## 2016-06-26 ENCOUNTER — Encounter (HOSPITAL_COMMUNITY): Payer: Self-pay | Admitting: Emergency Medicine

## 2016-06-26 DIAGNOSIS — F17200 Nicotine dependence, unspecified, uncomplicated: Secondary | ICD-10-CM | POA: Insufficient documentation

## 2016-06-26 DIAGNOSIS — J189 Pneumonia, unspecified organism: Secondary | ICD-10-CM

## 2016-06-26 DIAGNOSIS — J181 Lobar pneumonia, unspecified organism: Secondary | ICD-10-CM | POA: Insufficient documentation

## 2016-06-26 DIAGNOSIS — Z79899 Other long term (current) drug therapy: Secondary | ICD-10-CM | POA: Insufficient documentation

## 2016-06-26 DIAGNOSIS — J45909 Unspecified asthma, uncomplicated: Secondary | ICD-10-CM | POA: Insufficient documentation

## 2016-06-26 MED ORDER — ACETAMINOPHEN 500 MG PO TABS
1000.0000 mg | ORAL_TABLET | Freq: Once | ORAL | Status: AC
  Administered 2016-06-26: 1000 mg via ORAL
  Filled 2016-06-26: qty 2

## 2016-06-26 MED ORDER — AZITHROMYCIN 250 MG PO TABS: 250.0000 mg | ORAL_TABLET | Freq: Every day | ORAL | 0 refills | Status: DC

## 2016-06-26 MED ORDER — KETOROLAC TROMETHAMINE 60 MG/2ML IM SOLN
60.0000 mg | Freq: Once | INTRAMUSCULAR | Status: AC
  Administered 2016-06-26: 60 mg via INTRAMUSCULAR
  Filled 2016-06-26: qty 2

## 2016-06-26 MED ORDER — ALBUTEROL SULFATE (2.5 MG/3ML) 0.083% IN NEBU
5.0000 mg | INHALATION_SOLUTION | Freq: Once | RESPIRATORY_TRACT | Status: AC
  Administered 2016-06-26: 5 mg via RESPIRATORY_TRACT
  Filled 2016-06-26: qty 6

## 2016-06-26 MED ORDER — AZITHROMYCIN 250 MG PO TABS
500.0000 mg | ORAL_TABLET | Freq: Once | ORAL | Status: AC
  Administered 2016-06-26: 500 mg via ORAL
  Filled 2016-06-26: qty 2

## 2016-06-26 NOTE — ED Triage Notes (Signed)
Pt presents to ED with c/o shortness of breath, onset 2 days ago.  Pt reports that symptom is getting progressively worse that it now "hurts chest", especially on coughing.  Hx of asthma---- takes rescue inhaler but not giving adequate relief.  Pt also c/o generalized body aches.  Pt also reports that his cough is now productive of thick, yellowish phlegm.

## 2016-06-26 NOTE — ED Provider Notes (Signed)
[facility]-EMERGENCY DEPT Provider Note   CSN: [id number] Arrival date & time: 06/26/16  4098     History   Chief Complaint Chief Complaint  Patient presents with  . Shortness of Breath    HPI Dale Cox is a 21 y.o. male with a past medical history of asthma presenting today with worsening cough and chest pain. Patient states for the past 2 days he has had difficulty breathing, right-sided chest pain worse with a cough. His cough is productive of yellow thick sputum. He said subjective fevers and chills where he goes from being very hot or a cold. He has been using his albuterol inhaler without any further relief. He denies any sick contacts. There are no further complaints.  10 Systems reviewed and are negative for acute change except as noted in the HPI.    HPI  Past Medical History:  Diagnosis Date  . Asthma     There are no active problems to display for this patient.   History reviewed. No pertinent surgical history.     Home Medications    Prior to Admission medications   Medication Sig Start Date End Date Taking? Authorizing Provider  albuterol (PROVENTIL HFA;VENTOLIN HFA) 108 (90 BASE) MCG/ACT inhaler Inhale 2 puffs into the lungs every 4 (four) hours as needed for wheezing or shortness of breath. 05/04/15  Yes Kristen N Ward, DO  Multiple Vitamin (MULTIVITAMIN WITH MINERALS) TABS tablet Take 1 tablet by mouth daily.   Yes Historical Provider, MD  azithromycin (ZITHROMAX) 250 MG tablet Take 1 tablet (250 mg total) by mouth daily. 06/26/16   Tomasita Crumble, MD    Family History History reviewed. No pertinent family history.  Social History Social History  Substance Use Topics  . Smoking status: Current Some Day Smoker  . Smokeless tobacco: Never Used  . Alcohol use Yes     Allergies   Patient has no known allergies.   Review of Systems Review of Systems   Physical Exam Updated Vital Signs BP 143/77   Temp 99.7 F (37.6 C) (Oral)   Resp 18   Ht  5\' 8"  (1.727 m)   Wt 150 lb (68 kg)   SpO2 97%   BMI 22.81 kg/m   Physical Exam  Constitutional: He is oriented to person, place, and time. Vital signs are normal. He appears well-developed and well-nourished.  Non-toxic appearance. He does not appear ill. No distress.  HENT:  Head: Normocephalic and atraumatic.  Nose: Nose normal.  Mouth/Throat: Oropharynx is clear and moist. No oropharyngeal exudate.  Eyes: Conjunctivae and EOM are normal. Pupils are equal, round, and reactive to light. No scleral icterus.  Neck: Normal range of motion. Neck supple. No tracheal deviation, no edema, no erythema and normal range of motion present. No thyroid mass and no thyromegaly present.  Cardiovascular: Normal rate, regular rhythm, S1 normal, S2 normal, normal heart sounds, intact distal pulses and normal pulses.  Exam reveals no gallop and no friction rub.   No murmur heard. Pulmonary/Chest: Effort normal and breath sounds normal. No respiratory distress. He has no wheezes. He has no rhonchi. He has no rales.  Abdominal: Soft. Normal appearance and bowel sounds are normal. He exhibits no distension, no ascites and no mass. There is no hepatosplenomegaly. There is no tenderness. There is no rebound, no guarding and no CVA tenderness.  Musculoskeletal: Normal range of motion. He exhibits no edema or tenderness.  Lymphadenopathy:    He has no cervical adenopathy.  Neurological: He is alert  and oriented to person, place, and time. He has normal strength. No cranial nerve deficit or sensory deficit.  Skin: Skin is warm, dry and intact. No petechiae and no rash noted. He is not diaphoretic. No erythema. No pallor.  Tactile fever  Nursing note and vitals reviewed.    ED Treatments / Results  Labs (all labs ordered are listed, but only abnormal results are displayed) Labs Reviewed - No data to display  EKG  EKG Interpretation  Date/Time:  Sunday June 26 2016 05:49:47 EST Ventricular Rate:   85 PR Interval:    QRS Duration: 80 QT Interval:  334 QTC Calculation: 398 R Axis:   79 Text Interpretation:  Sinus rhythm LVH by voltage No significant change since last tracing Confirmed by Erroll Lunani, Odel Schmid Ayokunle [phone]([id number]) on 06/26/2016 6:04:04 AM       Radiology Dg Chest 2 View  Result Date: 06/26/2016 CLINICAL DATA:  Evaluation for acute shortness of breath. History of asthma. EXAM: CHEST  2 VIEW COMPARISON:  Prior radiograph from 07/14/2015. FINDINGS: Cardiac and mediastinal silhouettes are stable in size and contour, and remain within normal limits. Lungs are normally inflated. There is are subtle patchy and nodular densities within the left lung base, suspicious for possible developing infiltrate given the provided history. No other focal infiltrates identified. No pulmonary edema or pleural effusion. No pneumothorax. No acute osseus abnormality. IMPRESSION: Subtle patchy nodular densities within the left lung base, suspicious for possible early/developing infiltrate given the history productive cough. Electronically Signed   By: Rise MuBenjamin  McClintock M.D.   On: 06/26/2016 06:09    Procedures Procedures (including critical care time)  Medications Ordered in ED Medications  acetaminophen (TYLENOL) tablet 1,000 mg (not administered)  azithromycin (ZITHROMAX) tablet 500 mg (not administered)  ketorolac (TORADOL) injection 60 mg (not administered)  albuterol (PROVENTIL) (2.5 MG/3ML) 0.083% nebulizer solution 5 mg (5 mg Nebulization Given 06/26/16 0545)     Initial Impression / Assessment and Plan / ED Course  I have reviewed the triage vital signs and the nursing notes.  Pertinent labs & imaging results that were available during my care of the patient were reviewed by me and considered in my medical decision making (see chart for details).     Patient presents emergency department for fever cough and shortness of breath. Chest x-ray reveals a left lower lobe pneumonia. I retook his  temperature and it was 100.6. Will give Tylenol, Toradol, azithromycin for treatment. He has an inhaler with 118 puffs remaining. Primary care follow-up advised. Prescription for azithromycin given. Vital signs remain within his normal limits and is safe for discharge.  Final Clinical Impressions(s) / ED Diagnoses   Final diagnoses:  Community acquired pneumonia of right lower lobe of lung (HCC)    New Prescriptions New Prescriptions   AZITHROMYCIN (ZITHROMAX) 250 MG TABLET    Take 1 tablet (250 mg total) by mouth daily.     Tomasita CrumbleAdeleke Atul Delucia, MD 06/26/16 0630

## 2016-09-09 ENCOUNTER — Emergency Department (HOSPITAL_COMMUNITY): Payer: Self-pay

## 2016-09-09 ENCOUNTER — Encounter (HOSPITAL_COMMUNITY): Payer: Self-pay | Admitting: Emergency Medicine

## 2016-09-09 ENCOUNTER — Emergency Department (HOSPITAL_COMMUNITY)
Admission: EM | Admit: 2016-09-09 | Discharge: 2016-09-09 | Disposition: A | Discharge status: Home / Self Care | Payer: Self-pay | Attending: Emergency Medicine | Admitting: Emergency Medicine

## 2016-09-09 DIAGNOSIS — B9789 Other viral agents as the cause of diseases classified elsewhere: Secondary | ICD-10-CM

## 2016-09-09 DIAGNOSIS — F17200 Nicotine dependence, unspecified, uncomplicated: Secondary | ICD-10-CM | POA: Insufficient documentation

## 2016-09-09 DIAGNOSIS — J069 Acute upper respiratory infection, unspecified: Secondary | ICD-10-CM | POA: Insufficient documentation

## 2016-09-09 DIAGNOSIS — J45909 Unspecified asthma, uncomplicated: Secondary | ICD-10-CM | POA: Insufficient documentation

## 2016-09-09 MED ORDER — GUAIFENESIN-DM 100-10 MG/5ML PO SYRP: 5.0000 mL | ORAL_SOLUTION | ORAL | 0 refills | Status: DC | PRN

## 2016-09-09 MED ORDER — BENZONATATE 100 MG PO CAPS: 100.0000 mg | ORAL_CAPSULE | Freq: Two times a day (BID) | ORAL | 0 refills | Status: DC | PRN

## 2016-09-09 MED ORDER — IPRATROPIUM-ALBUTEROL 0.5-2.5 (3) MG/3ML IN SOLN
3.0000 mL | Freq: Once | RESPIRATORY_TRACT | Status: AC
  Administered 2016-09-09: 3 mL via RESPIRATORY_TRACT
  Filled 2016-09-09: qty 3

## 2016-09-09 MED ORDER — ALBUTEROL SULFATE HFA 108 (90 BASE) MCG/ACT IN AERS
2.0000 | INHALATION_SPRAY | Freq: Once | RESPIRATORY_TRACT | Status: AC
  Administered 2016-09-09: 2 via RESPIRATORY_TRACT
  Filled 2016-09-09: qty 6.7

## 2016-09-09 MED ORDER — IBUPROFEN 800 MG PO TABS: 800.0000 mg | ORAL_TABLET | Freq: Three times a day (TID) | ORAL | 0 refills | Status: DC | PRN

## 2016-09-09 NOTE — ED Provider Notes (Signed)
[facility]-EMERG[facility]Y DEPT Provider Note   CSN: [id number] Arrival date & time: 09/09/16  4098  By signing my name below, I, Dale Cox, attest that this documentation has been prepared under the direction and in the presence of [facility], PA-C.  Electronically Signed: Rosario Cox, ED Scribe. 09/09/16. 10:56 AM.  History   Chief Complaint Chief Complaint  Patient presents with  . Cough   The history is provided by the patient. No language interpreter was used.    HPI Comments: Dale Cox is a 21 y.o. male with a h/o asthma, who presents to the Emergency Department complaining of persistent cough productive of yellow sputum beginning several days ago. He notes associated chills, sore throat, generalized myalgias, wheezing, and congestion. He took one dose of Claritin this morning with some relief; no other treatments were tried. Pt reports that he had a new onset of PNA one month ago which resolved at that time and his symptoms feel like a recurrence of this. Pt did not receive his yearly influenza immunization. He has recently been around an individual who was sick with similar symptoms. He denies rhinorrhea, ear pain/drainage, shortness of breath, or any other associated symptoms.   Past Medical History:  Diagnosis Date  . Asthma    There are no active problems to display for this patient.  History reviewed. No pertinent surgical history.  Home Medications    Prior to Admission medications   Medication Sig Start Date End Date Taking? Authorizing Provider  albuterol (PROVENTIL HFA;VENTOLIN HFA) 108 (90 BASE) MCG/ACT inhaler Inhale 2 puffs into the lungs every 4 (four) hours as needed for wheezing or shortness of breath. 05/04/15   Kristen N Ward, DO  azithromycin (ZITHROMAX) 250 MG tablet Take 1 tablet (250 mg total) by mouth daily. 06/26/16   Tomasita Crumble, MD  benzonatate (TESSALON) 100 MG capsule Take 1 capsule (100 mg total) by mouth 2 (two) times daily as needed for  cough. 09/09/16   Trixie Dredge, PA-C  guaiFENesin-dextromethorphan (ROBITUSSIN DM) 100-10 MG/5ML syrup Take 5 mLs by mouth every 4 (four) hours as needed for cough (and congestion). 09/09/16   Trixie Dredge, PA-C  ibuprofen (ADVIL,MOTRIN) 800 MG tablet Take 1 tablet (800 mg total) by mouth every 8 (eight) hours as needed for mild pain or moderate pain. 09/09/16   Trixie Dredge, PA-C  Multiple Vitamin (MULTIVITAMIN WITH MINERALS) TABS tablet Take 1 tablet by mouth daily.    Historical Provider, MD    Family History History reviewed. No pertinent family history.  Social History Social History  Substance Use Topics  . Smoking status: Current Some Day Smoker  . Smokeless tobacco: Never Used  . Alcohol use Yes     Allergies   Patient has no known allergies.  Review of Systems Review of Systems  Constitutional: Positive for chills.  HENT: Positive for congestion and sore throat. Negative for ear discharge, ear pain, rhinorrhea and trouble swallowing.   Respiratory: Positive for cough and wheezing. Negative for shortness of breath.   Cardiovascular: Negative for leg swelling.  Musculoskeletal: Negative for neck pain and neck stiffness.  Skin: Negative for rash.  Allergic/Immunologic: Negative for immunocompromised state.   Physical Exam Updated Vital Signs BP 131/75   Pulse 69   Temp 98.2 F (36.8 C) (Oral)   Resp 16   SpO2 99%   Physical Exam  Constitutional: He appears well-developed and well-nourished. No distress.  HENT:  Head: Normocephalic and atraumatic.  Mouth/Throat: Mucous membranes are normal. Posterior oropharyngeal  erythema present. No oropharyngeal exudate, posterior oropharyngeal edema or tonsillar abscesses.  Posterior oropharynx with erythema, but without edema or exudates.   Eyes: Conjunctivae and EOM are normal. Right eye exhibits no discharge. Left eye exhibits no discharge.  Neck: Normal range of motion. Neck supple.  Cardiovascular: Normal rate and regular rhythm.     Pulmonary/Chest: Effort normal and breath sounds normal. No stridor. No respiratory distress. He has no wheezes. He has no rales.  Lymphadenopathy:    He has no cervical adenopathy.  Neurological: He is alert.  Skin: He is not diaphoretic.  Nursing note and vitals reviewed.  ED Treatments / Results  DIAGNOSTIC STUDIES: Oxygen Saturation is 99% on RA, normal by my interpretation.   COORDINATION OF CARE: 10:46 AM-Discussed next steps with pt. Pt verbalized understanding and is agreeable with the plan.   Labs (all labs ordered are listed, but only abnormal results are displayed) Labs Reviewed - No data to display  EKG  EKG Interpretation None      Radiology Dg Chest 2 View  Result Date: 09/09/2016 CLINICAL DATA:  Cough and short of breath EXAM: CHEST  2 VIEW COMPARISON:  06/26/2016 FINDINGS: Interval clearing of left lower lobe infiltrate. Lungs now clear without infiltrate effusion or mass. IMPRESSION: No active cardiopulmonary disease. Electronically Signed   By: Marlan Palauharles  Clark M.D.   On: 09/09/2016 11:54    Procedures Procedures   Medications Ordered in ED Medications  ipratropium-albuterol (DUONEB) 0.5-2.5 (3) MG/3ML nebulizer solution 3 mL (3 mLs Nebulization Given 09/09/16 1124)  albuterol (PROVENTIL HFA;VENTOLIN HFA) 108 (90 Base) MCG/ACT inhaler 2 puff (2 puffs Inhalation Given 09/09/16 1124)    Initial Impression / Assessment and Plan / ED Course  I have reviewed the triage vital signs and the nursing notes.  Pertinent labs & imaging results that were available during my care of the patient were reviewed by me and considered in my medical decision making (see chart for details).     Afebrile, nontoxic patient with constellation of symptoms suggestive of viral syndrome.  No concerning findings on exam.  Discharged home with supportive care, PCP follow up.  Discussed result, findings, treatment, and follow up  with patient.  Pt given return precautions.  Pt verbalizes  understanding and agrees with plan.      Final Clinical Impressions(s) / ED Diagnoses   Final diagnoses:  Viral URI with cough   New Prescriptions Discharge Medication List as of 09/09/2016 12:15 PM    START taking these medications   Details  benzonatate (TESSALON) 100 MG capsule Take 1 capsule (100 mg total) by mouth 2 (two) times daily as needed for cough., Starting Fri 09/09/2016, Print    guaiFENesin-dextromethorphan (ROBITUSSIN DM) 100-10 MG/5ML syrup Take 5 mLs by mouth every 4 (four) hours as needed for cough (and congestion)., Starting Fri 09/09/2016, Print    ibuprofen (ADVIL,MOTRIN) 800 MG tablet Take 1 tablet (800 mg total) by mouth every 8 (eight) hours as needed for mild pain or moderate pain., Starting Fri 09/09/2016, Print       I personally performed the services described in this documentation, which was scribed in my presence. The recorded information has been reviewed and is accurate.     Trixie Dredgemily Dyllen Menning, PA-C 09/09/16 1508    Arby BarrettePfeiffer, Marcy, MD 09/11/16 [phone]

## 2016-09-09 NOTE — ED Notes (Signed)
States had pneu a month ago and he feels like he is comiing down with it again, pt has body aches and feels like he has the chills hurts to cough

## 2016-09-09 NOTE — Discharge Instructions (Signed)
Read the information below.  Use the prescribed medication as directed.  Please discuss all new medications with your pharmacist.  You may return to the Emergency Department at any time for worsening condition or any new symptoms that concern you.  If you develop high fevers that do not resolve with tylenol or ibuprofen, you have difficulty swallowing or breathing, or you are unable to tolerate fluids by mouth, return to the ER for a recheck.    °

## 2016-09-09 NOTE — ED Triage Notes (Signed)
Pt reports chills and cough. Was treated for PNA a month ago, feels like same. Hx of asthma. No SOB or obvious wheezing in triage.

## 2018-10-08 IMAGING — CR DG CHEST 2V
2 series · 2 of 2 positions shown · non-contrast
Comparison: 06/26/2016

CLINICAL DATA: Cough and short of breath

EXAM:
CHEST  2 VIEW

[w chest pa]
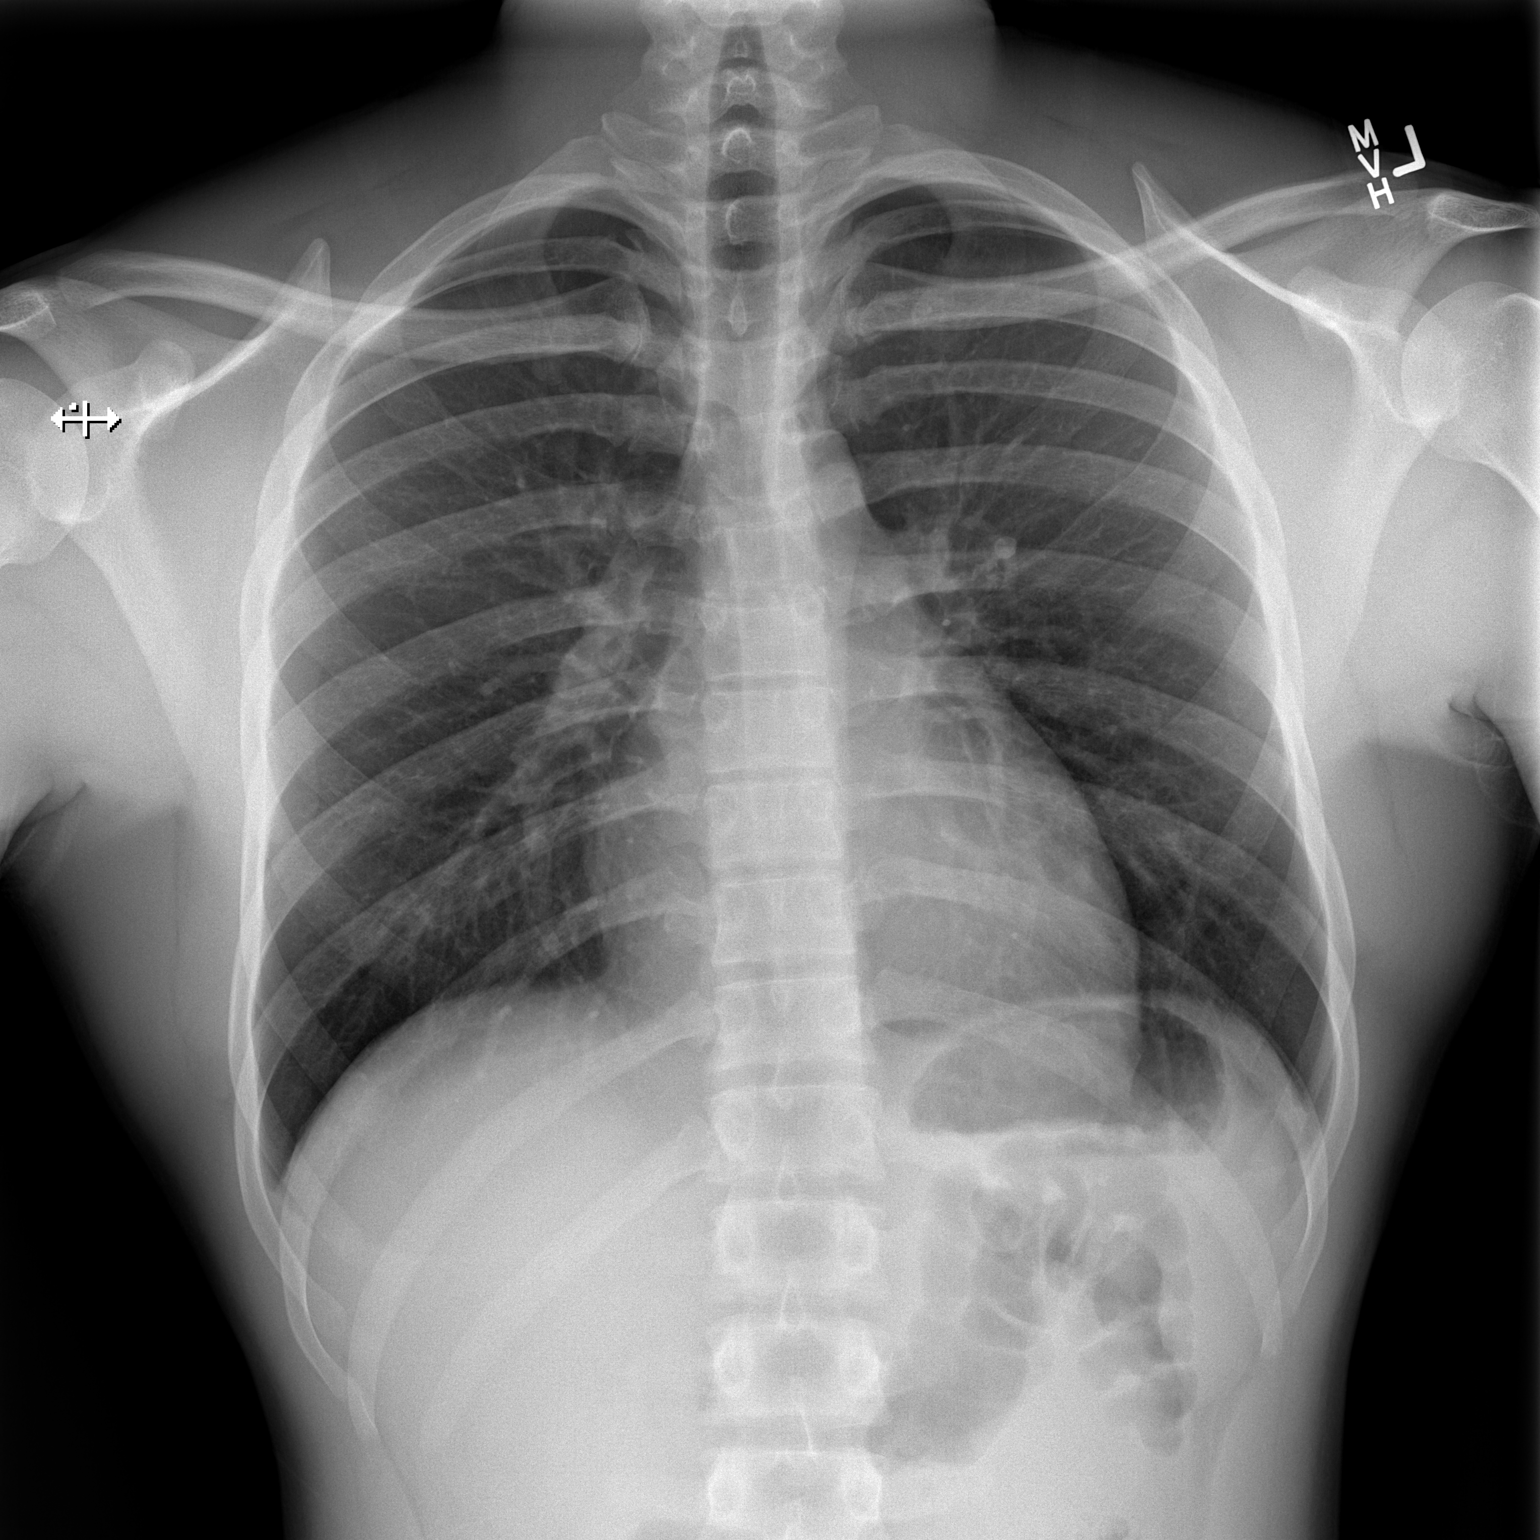

[w chest lat]
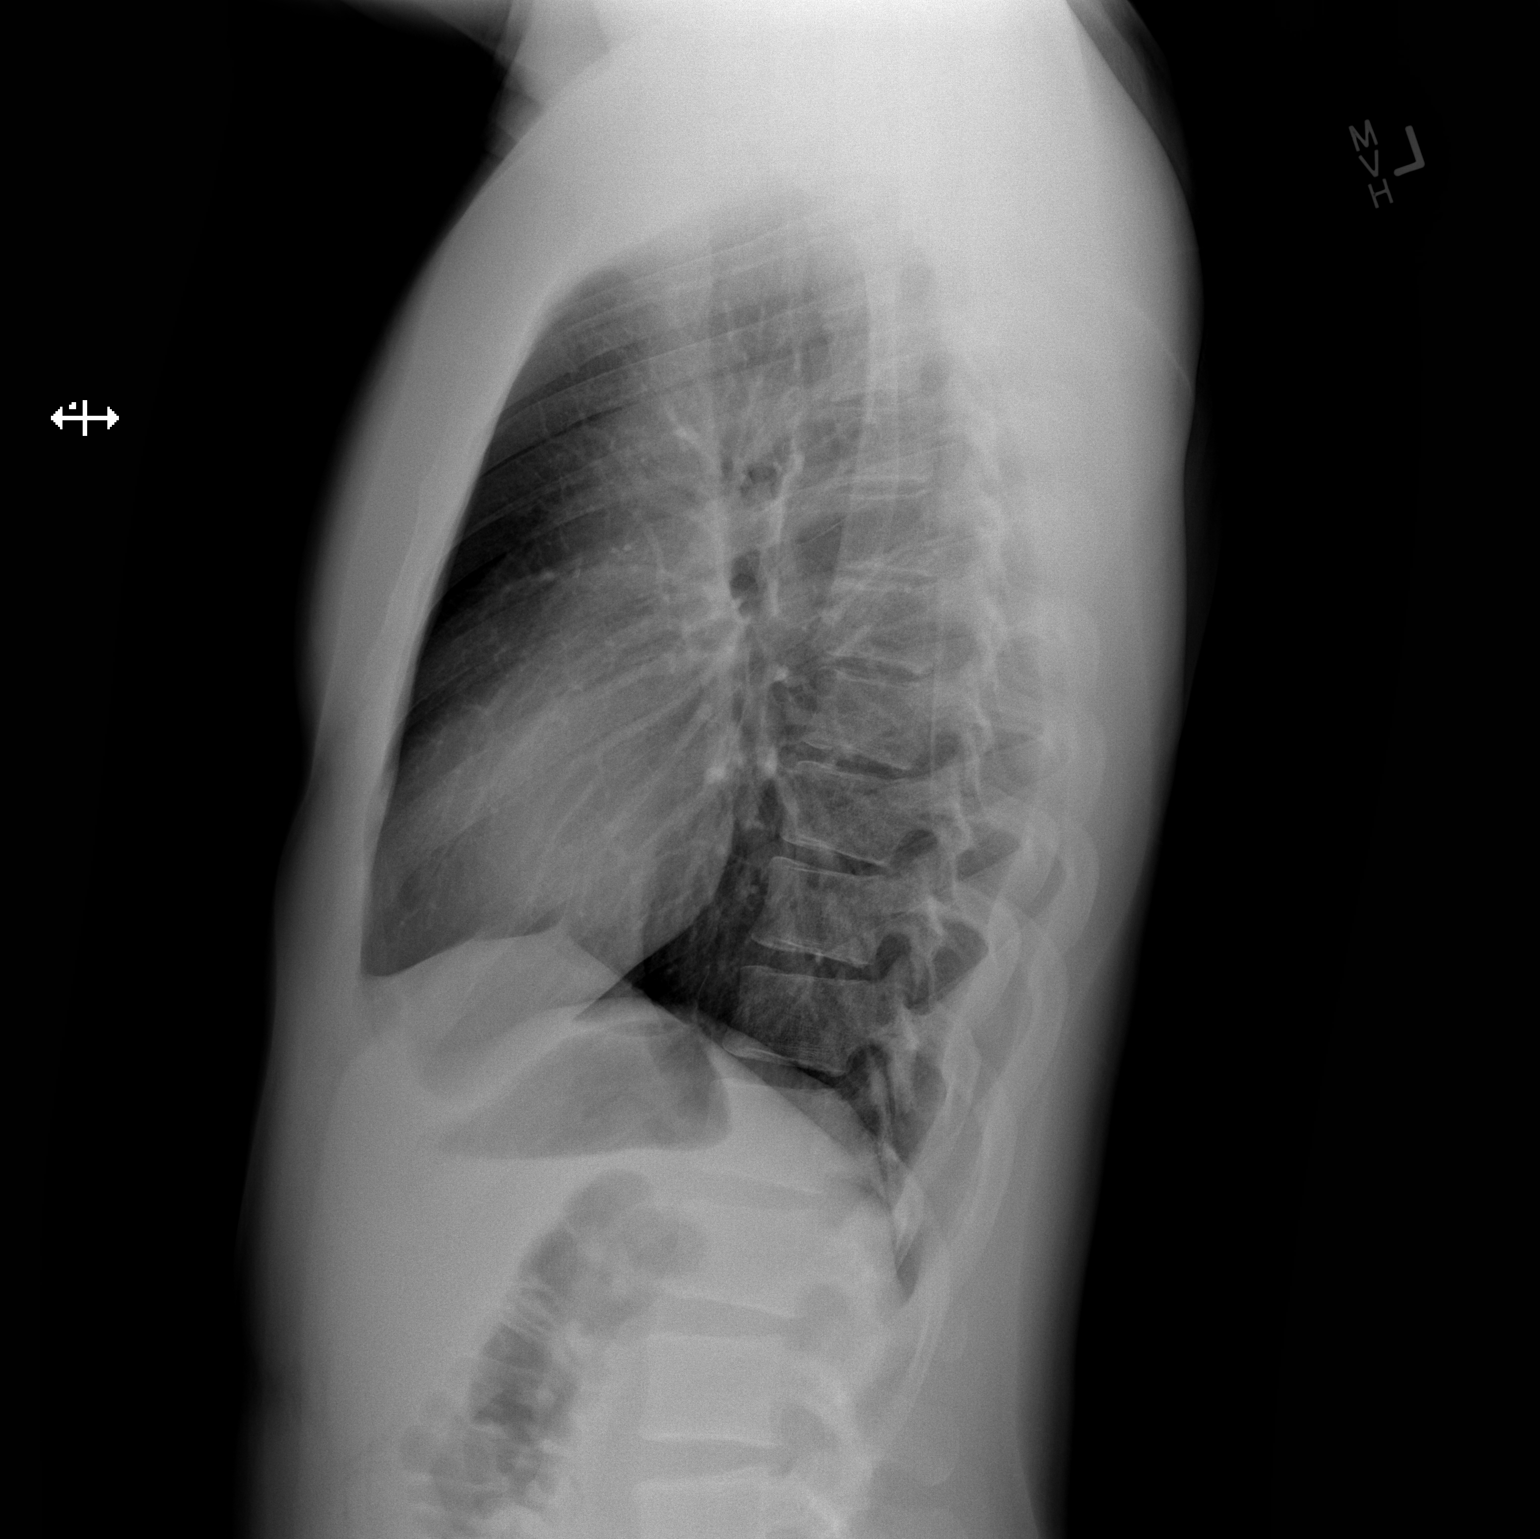

[2 of 2 positions shown; findings below may reference images not displayed]

FINDINGS: Interval clearing of left lower lobe infiltrate. Lungs now clear
without infiltrate effusion or mass.
IMPRESSION: No active cardiopulmonary disease.

## 2022-02-02 ENCOUNTER — Encounter (HOSPITAL_COMMUNITY): Payer: Self-pay | Admitting: Emergency Medicine

## 2022-02-02 ENCOUNTER — Emergency Department (HOSPITAL_COMMUNITY)
Admission: EM | Admit: 2022-02-02 | Discharge: 2022-02-02 | Disposition: A | Discharge status: Home / Self Care | Payer: Self-pay | Attending: Emergency Medicine | Admitting: Emergency Medicine

## 2022-02-02 ENCOUNTER — Other Ambulatory Visit: Payer: Self-pay

## 2022-02-02 ENCOUNTER — Ambulatory Visit
Admission: EM | Admit: 2022-02-02 | Discharge: 2022-02-02 | Disposition: A | Discharge status: Other Acute Inpatient Hospital | Payer: Self-pay

## 2022-02-02 ENCOUNTER — Emergency Department (HOSPITAL_COMMUNITY): Payer: Self-pay

## 2022-02-02 DIAGNOSIS — A5449 Gonococcal infection of other musculoskeletal tissue: Secondary | ICD-10-CM | POA: Insufficient documentation

## 2022-02-02 DIAGNOSIS — R1030 Lower abdominal pain, unspecified: Secondary | ICD-10-CM

## 2022-02-02 DIAGNOSIS — R110 Nausea: Secondary | ICD-10-CM

## 2022-02-02 DIAGNOSIS — A549 Gonococcal infection, unspecified: Secondary | ICD-10-CM

## 2022-02-02 LAB — CBC WITH DIFFERENTIAL/PLATELET
Abs Immature Granulocytes: 0.01 10*3/uL (ref 0.00–0.07)
Basophils Absolute: 0 10*3/uL (ref 0.0–0.1)
Basophils Relative: 1 %
Eosinophils Absolute: 0.1 10*3/uL (ref 0.0–0.5)
Eosinophils Relative: 2 %
HCT: 45.5 % (ref 39.0–52.0)
Hemoglobin: 14.8 g/dL (ref 13.0–17.0)
Immature Granulocytes: 0 %
Lymphocytes Relative: 26 %
Lymphs Abs: 1.2 10*3/uL (ref 0.7–4.0)
MCH: 30.4 pg (ref 26.0–34.0)
MCHC: 32.5 g/dL (ref 30.0–36.0)
MCV: 93.4 fL (ref 80.0–100.0)
Monocytes Absolute: 0.6 10*3/uL (ref 0.1–1.0)
Monocytes Relative: 12 %
Neutro Abs: 2.7 10*3/uL (ref 1.7–7.7)
Neutrophils Relative %: 59 %
Platelets: 249 10*3/uL (ref 150–400)
RBC: 4.87 MIL/uL (ref 4.22–5.81)
RDW: 12.2 % (ref 11.5–15.5)
WBC: 4.6 10*3/uL (ref 4.0–10.5)
nRBC: 0 % (ref 0.0–0.2)

## 2022-02-02 LAB — COMPREHENSIVE METABOLIC PANEL
ALT: 15 U/L (ref 0–44)
AST: 17 U/L (ref 15–41)
Albumin: 4.1 g/dL (ref 3.5–5.0)
Alkaline Phosphatase: 66 U/L (ref 38–126)
Anion gap: 7 (ref 5–15)
BUN: 12 mg/dL (ref 6–20)
CO2: 30 mmol/L (ref 22–32)
Calcium: 9.5 mg/dL (ref 8.9–10.3)
Chloride: 105 mmol/L (ref 98–111)
Creatinine, Ser: 1 mg/dL (ref 0.61–1.24)
GFR, Estimated: >60 mL/min (ref >60)
Glucose, Bld: 81 mg/dL (ref 70–99)
Potassium: 4.1 mmol/L (ref 3.5–5.1)
Sodium: 142 mmol/L (ref 135–145)
Total Bilirubin: 1.2 mg/dL (ref 0.3–1.2)
Total Protein: 6.7 g/dL (ref 6.5–8.1)

## 2022-02-02 LAB — URINALYSIS, ROUTINE W REFLEX MICROSCOPIC
Bilirubin Urine: NEGATIVE
Glucose, UA: NEGATIVE mg/dL
Ketones, ur: NEGATIVE mg/dL
Nitrite: NEGATIVE
Protein, ur: NEGATIVE mg/dL
Specific Gravity, Urine: >1.03 — ABNORMAL HIGH (ref 1.005–1.030)
pH: 6 (ref 5.0–8.0)

## 2022-02-02 LAB — URINALYSIS, MICROSCOPIC (REFLEX)

## 2022-02-02 LAB — LIPASE, BLOOD: Lipase: 34 U/L (ref 11–51)

## 2022-02-02 MED ORDER — GENTAMICIN SULFATE 40 MG/ML IJ SOLN
240.0000 mg | Freq: Once | INTRAMUSCULAR | Status: AC
Start: 2022-02-02 — End: 2022-02-02
  Administered 2022-02-02: 240 mg via INTRAMUSCULAR
  Filled 2022-02-02: qty 6

## 2022-02-02 MED ORDER — AZITHROMYCIN 250 MG PO TABS
2000.0000 mg | ORAL_TABLET | Freq: Once | ORAL | Status: AC
  Administered 2022-02-02: 2000 mg via ORAL
  Filled 2022-02-02: qty 8

## 2022-02-02 NOTE — Discharge Instructions (Addendum)
Avoid any sexual contact until your symptoms completely resolve and your partner may need retreatment.  When you do start having sex again make sure you are using protection.  If your symptoms do not seem to go away you need to follow-up with the health department for further evaluation.  The CAT scan today was normal.

## 2022-02-02 NOTE — ED Triage Notes (Signed)
Pt c/o of abdominal pain that radiates towards his lower back with complaints of nausea. Says he got treated for gonorrhea and says after he got back active the pain came back.

## 2022-02-02 NOTE — ED Provider Triage Note (Signed)
Emergency Medicine Provider Triage Evaluation Note  Dale Cox , a 26 y.o. male  was evaluated in triage.  Pt complains of lower abdominal pain which been ongoing for 2 weeks.  Patient was evaluated urgent care this morning who had concerns recommend he come to the emergency department further evaluation.  Patient states that he has had symptoms similar to this in the past which were due to an underlying gonorrhea infection.  Patient denies penile discharge, dysuria at this time.  States he has a monogamous partner.  Endorses nausea but denies vomiting, diarrhea, constipation, chest pain, shortness of breath  Review of Systems  Positive: As above Negative: As above  Physical Exam  BP 132/82 (BP Location: Right Arm)   Pulse 71   Temp 98.3 F (36.8 C) (Oral)   Resp 18   SpO2 100%  Gen:   Awake, no distress   Resp:  Normal effort  MSK:   Moves extremities without difficulty  Other:  No abdominal tenderness to palpation  Medical Decision Making  Medically screening exam initiated at 10:48 AM.  Appropriate orders placed.  Dale Cox was informed that the remainder of the evaluation will be completed by another provider, this initial triage assessment does not replace that evaluation, and the importance of remaining in the ED until their evaluation is complete.     Dorothyann Peng, PA-C 02/02/22 1049

## 2022-02-02 NOTE — ED Provider Notes (Signed)
[facility] Provider Note   CSN: [id number] Arrival date & time: 02/02/22  1016     History  Chief Complaint  Patient presents with   Abdominal Pain    Dale Cox is a 26 y.o. male.  Patient is a 26 year old male with no known medical history except for treatment for gonorrhea with a shot and pills 3 weeks to 1 month ago who is presenting today with 2 weeks of worsening pelvic pain that radiates into his back and is coming and going as well as some mild dysuria.  He reports that a month ago when he was having symptoms he was having similar pain but he was also having penile discharge at that time.  He did test positive for gonorrhea had been treated with a shot and had taken some pills.  He is sexually active with 1 partner who is male.  She was also treated.  He reports he started having sex again and approximately 2 weeks ago started noticing some mild discomfort in his pelvis intermittently that has worsened this last week.  He feels sometimes that his testicles feel swollen and sore but denies any lesions or ongoing swelling.  There is nothing he can do that seems to make the pain worse.  He is not having any pain with sexual activity.  Again some minimal dysuria with urination but he has not noticed any penile discharge at this time.  He has not had fever but is occasionally nauseated but has not had any vomiting.  Since treatment he has only been sexually active with a partner who was treated.  As far as he knows that she is asymptomatic.  Patient was seen at urgent care earlier today and was sent here due to the significant amount of abdominal pain he had.  He reports it is gotten better at this time.  The history is provided by the patient.  Abdominal Pain      Home Medications Prior to Admission medications   Medication Sig Start Date End Date Taking? Authorizing Provider  albuterol (PROVENTIL HFA;VENTOLIN HFA) 108 (90 BASE) MCG/ACT  inhaler Inhale 2 puffs into the lungs every 4 (four) hours as needed for wheezing or shortness of breath. 05/04/15   Ward, Delice Bison, DO  azithromycin (ZITHROMAX) 250 MG tablet Take 1 tablet (250 mg total) by mouth daily. 06/26/16   Everlene Balls, MD  benzonatate (TESSALON) 100 MG capsule Take 1 capsule (100 mg total) by mouth 2 (two) times daily as needed for cough. 09/09/16   Clayton Bibles, PA-C  guaiFENesin-dextromethorphan (ROBITUSSIN DM) 100-10 MG/5ML syrup Take 5 mLs by mouth every 4 (four) hours as needed for cough (and congestion). 09/09/16   Clayton Bibles, PA-C  ibuprofen (ADVIL,MOTRIN) 800 MG tablet Take 1 tablet (800 mg total) by mouth every 8 (eight) hours as needed for mild pain or moderate pain. 09/09/16   Clayton Bibles, PA-C  Multiple Vitamin (MULTIVITAMIN WITH MINERALS) TABS tablet Take 1 tablet by mouth daily.    [provider]      Allergies    Patient has no known allergies.    Review of Systems   Review of Systems  Gastrointestinal:  Positive for abdominal pain.    Physical Exam Updated Vital Signs BP 132/82 (BP Location: Right Arm)   Pulse 71   Temp 98.3 F (36.8 C) (Oral)   Resp 18   SpO2 100%  Physical Exam Vitals and nursing note reviewed.  Constitutional:  General: He is not in acute distress.    Appearance: He is well-developed.  HENT:     Head: Normocephalic and atraumatic.  Eyes:     Conjunctiva/sclera: Conjunctivae normal.     Pupils: Pupils are equal, round, and reactive to light.  Cardiovascular:     Rate and Rhythm: Normal rate and regular rhythm.     Heart sounds: No murmur heard. Pulmonary:     Effort: Pulmonary effort is normal. No respiratory distress.     Breath sounds: Normal breath sounds. No wheezing or rales.  Abdominal:     General: There is no distension.     Palpations: Abdomen is soft.     Tenderness: There is abdominal tenderness in the suprapubic area. There is no right CVA tenderness, left CVA tenderness, guarding or rebound.   Musculoskeletal:        General: No tenderness. Normal range of motion.     Cervical back: Normal range of motion and neck supple.  Skin:    General: Skin is warm and dry.     Findings: No erythema or rash.  Neurological:     Mental Status: He is alert and oriented to person, place, and time.  Psychiatric:        Behavior: Behavior normal.     ED Results / Procedures / Treatments   Labs (all labs ordered are listed, but only abnormal results are displayed) Labs Reviewed  URINALYSIS, ROUTINE W REFLEX MICROSCOPIC - Abnormal; Notable for the following components:      Result Value   APPearance CLOUDY (*)    Specific Gravity, Urine >1.030 (*)    Hgb urine dipstick SMALL (*)    Leukocytes,Ua MODERATE (*)    All other components within normal limits  URINALYSIS, MICROSCOPIC (REFLEX) - Abnormal; Notable for the following components:   Bacteria, UA FEW (*)    Non Squamous Epithelial PRESENT (*)    All other components within normal limits  CBC WITH DIFFERENTIAL/PLATELET  COMPREHENSIVE METABOLIC PANEL  LIPASE, BLOOD  GC/CHLAMYDIA PROBE AMP ([place]) NOT AT [facility]    EKG None  Radiology No results found.  Procedures Procedures    Medications Ordered in ED Medications  azithromycin (ZITHROMAX) tablet 2,000 mg (2,000 mg Oral Given 02/02/22 1451)  gentamicin (GARAMYCIN) injection 240 mg (240 mg Intramuscular Given 02/02/22 1452)    ED Course/ Medical Decision Making/ A&P                           Medical Decision Making Amount and/or Complexity of Data Reviewed Radiology: ordered.  Risk Prescription drug management.   Pt  presenting today with a complaint that caries a high risk for morbidity and mortality.  Having recurrent symptoms of pelvic pain and some mild dysuria.  He denies any penile discharge at this time and did receive appropriate treatment for gonorrhea.  His partner was also treated but concern for possible failure of treatment versus UTI versus  pyelonephritis versus renal stone.  Patient is well-appearing at this time and only has minimal suprapubic tenderness.  Low suspicion for testicular torsion or epididymitis.  I independently interpreted patient's labs today and UA does show small hemoglobin, moderate leukocytes and 21-50 white cells concern for urethritis, CBC, BMP and lipase are all within normal limits.  We will do a CT to ensure no evidence of stone or pyelonephritis.  If negative we will plan on treating for resistant gonorrhea. I have independently visualized and interpreted  pt's images today.  CT renal neg for stones or other acute pathology today.  After speaking with pharmacy due to concern for resistant gonorrhea patient was given IM gentamicin 240 mg and 2 g of azithromycin p.o.  He was instructed to avoid all sexual interaction until his symptoms resolve and his partner will most likely need to have treatment with the other antibiotic regime.  He was instructed to follow-up with the health department if his symptoms or not improving.          Final Clinical Impression(s) / ED Diagnoses Final diagnoses:  Gonorrhea in male    Rx / DC Orders ED Discharge Orders     None         Gwyneth Sprout, MD 02/02/22 1458

## 2022-02-02 NOTE — Progress Notes (Signed)
Pharmacy Antibiotic Note  Dale Cox is a 26 y.o. male admitted on 02/02/2022 with  Ceftriaxone-resistant gonorrhea .  Pharmacy has been consulted for Gentamicin IM dosing.  Plan: Gentamicin 240mg  IM x1 Azithromycin 2000mg  PO x 1 (per MD)     Temp (24hrs), Avg:98.3 F (36.8 C), Min:98.2 F (36.8 C), Max:98.3 F (36.8 C)  Recent Labs  Lab 02/02/22 1056  WBC 4.6  CREATININE 1.00    CrCl cannot be calculated (Unknown ideal weight.).    No Known Allergies   Thank you for allowing pharmacy to be a part of this patient's care.  Luisa Hart, PharmD, BCPS Clinical Pharmacist 02/02/2022 2:33 PM   Please refer to [facility] for pharmacy phone number

## 2022-02-02 NOTE — ED Provider Notes (Addendum)
[facility]    CSN: [id number] Arrival date & time: 02/02/22  0905      History   Chief Complaint Chief Complaint  Patient presents with   Abdominal Pain    HPI Dale Cox is a 26 y.o. male.   Patient presents for further evaluation and management of abdominal pain that has been present for about 2 weeks.  Patient reports that abdominal pain is worsened over the past few days and currently rates pain 8/10 on pain scale.  He states the pain is in the lower abdomen and is radiating bilaterally around to his back.  He reports that he has also had nausea without vomiting.  Denies diarrhea and last bowel movement was yesterday.  Although, patient reports that he has been having issues with constipation over the past few days.  Denies any recent blood in stool.  Patient reports that he was treated for gonorrhea prior to symptoms starting.  He states that he had similar abdominal pain that was more mild in nature when he had gonorrhea but also had associated penile discharge. Those symptoms resolved per patient, and these are new symptoms. He states that him and his sexual partner were both treated and tested.  Denies any new sexual partners and states that recent sexual encounters are with the same sexual partner.  Denies any dysuria, urinary frequency, penile discharge.  Denies any associated fever.  Patient describes abdominal pain as a constant aching sensation.   Abdominal Pain   Past Medical History:  Diagnosis Date   Asthma     There are no problems to display for this patient.   History reviewed. No pertinent surgical history.     Home Medications    Prior to Admission medications   Medication Sig Start Date End Date Taking? Authorizing Provider  albuterol (PROVENTIL HFA;VENTOLIN HFA) 108 (90 BASE) MCG/ACT inhaler Inhale 2 puffs into the lungs every 4 (four) hours as needed for wheezing or shortness of breath. 05/04/15   Ward, Delice Bison, DO  azithromycin  (ZITHROMAX) 250 MG tablet Take 1 tablet (250 mg total) by mouth daily. 06/26/16   Everlene Balls, MD  benzonatate (TESSALON) 100 MG capsule Take 1 capsule (100 mg total) by mouth 2 (two) times daily as needed for cough. 09/09/16   Clayton Bibles, PA-C  guaiFENesin-dextromethorphan (ROBITUSSIN DM) 100-10 MG/5ML syrup Take 5 mLs by mouth every 4 (four) hours as needed for cough (and congestion). 09/09/16   Clayton Bibles, PA-C  ibuprofen (ADVIL,MOTRIN) 800 MG tablet Take 1 tablet (800 mg total) by mouth every 8 (eight) hours as needed for mild pain or moderate pain. 09/09/16   Clayton Bibles, PA-C  Multiple Vitamin (MULTIVITAMIN WITH MINERALS) TABS tablet Take 1 tablet by mouth daily.    [provider]    Family History History reviewed. No pertinent family history.  Social History Social History   Tobacco Use   Smoking status: Some Days   Smokeless tobacco: Never  Substance Use Topics   Alcohol use: Yes   Drug use: Yes    Types: Marijuana     Allergies   Patient has no known allergies.   Review of Systems Review of Systems Per HPI  Physical Exam Triage Vital Signs ED Triage Vitals  Enc Vitals Group     BP 02/02/22 0923 125/81     Pulse Rate 02/02/22 0923 60     Resp --      Temp 02/02/22 0923 98.2 F (36.8 C)  Temp Source 02/02/22 0923 Oral     SpO2 02/02/22 0923 99 %     Weight --      Height --      Head Circumference --      Peak Flow --      Pain Score 02/02/22 0924 8     Pain Loc --      Pain Edu? --      Excl. in GC? --    No data found.  Updated Vital Signs BP 125/81 (BP Location: Left Arm)   Pulse 60   Temp 98.2 F (36.8 C) (Oral)   SpO2 99%   Visual Acuity Right Eye Distance:   Left Eye Distance:   Bilateral Distance:    Right Eye Near:   Left Eye Near:    Bilateral Near:     Physical Exam Constitutional:      General: He is not in acute distress.    Appearance: Normal appearance. He is not toxic-appearing or diaphoretic.  HENT:     Head:  Normocephalic and atraumatic.  Eyes:     Extraocular Movements: Extraocular movements intact.     Conjunctiva/sclera: Conjunctivae normal.  Cardiovascular:     Rate and Rhythm: Normal rate and regular rhythm.     Pulses: Normal pulses.     Heart sounds: Normal heart sounds.  Pulmonary:     Effort: Pulmonary effort is normal. No respiratory distress.     Breath sounds: Normal breath sounds.  Abdominal:     General: Abdomen is flat. Bowel sounds are normal. There is no distension.     Palpations: Abdomen is soft.     Tenderness: There is abdominal tenderness in the suprapubic area and left lower quadrant.     Hernia: No hernia is present.  Neurological:     General: No focal deficit present.     Mental Status: He is alert and oriented to person, place, and time. Mental status is at baseline.  Psychiatric:        Mood and Affect: Mood normal.        Behavior: Behavior normal.        Thought Content: Thought content normal.        Judgment: Judgment normal.      UC Treatments / Results  Labs (all labs ordered are listed, but only abnormal results are displayed) Labs Reviewed - No data to display  EKG   Radiology No results found.  Procedures Procedures (including critical care time)  Medications Ordered in UC Medications - No data to display  Initial Impression / Assessment and Plan / UC Course  I have reviewed the triage vital signs and the nursing notes.  Pertinent labs & imaging results that were available during my care of the patient were reviewed by me and considered in my medical decision making (see chart for details).     Given that symptoms are worsening and patient is complaining of 8/10 abdominal pain, I do think this warrants imaging of the abdomen to rule out any worrisome etiologies.  Do not have these capabilities here in urgent care so patient was advised to go to the emergency department for further evaluation and management.  Patient was agreeable  with plan.  Vital signs and patient stable at discharge.  Agree with patient self transport to the hospital. Final Clinical Impressions(s) / UC Diagnoses   Final diagnoses:  Lower abdominal pain  Nausea without vomiting     Discharge Instructions  Please go to the Emergency department as soon as you leave urgent care for more extensive evaluation and management as I feel like a CT scan of your abdomen may be necessary.     ED Prescriptions   None    PDMP not reviewed this encounter.   Gustavus Bryant, [place] 02/02/22 0938    Gustavus Bryant, [place] 02/02/22 [phone]

## 2022-02-02 NOTE — ED Notes (Signed)
Patient is being discharged from the Urgent Care and sent to the Emergency Department via POV . Per [facility] NP, patient is in need of higher level of care due to severe abdominal pain. Patient is aware and verbalizes understanding of plan of care.  Vitals:   02/02/22 0923  BP: 125/81  Pulse: 60  Temp: 98.2 F (36.8 C)  SpO2: 99%

## 2022-02-02 NOTE — ED Notes (Signed)
Discharge instructions reviewed with patient. Patient denies any questions or concerns. Patient ambulatory out of ED. 

## 2022-02-02 NOTE — Discharge Instructions (Signed)
Please go to the Emergency department as soon as you leave urgent care for more extensive evaluation and management as I feel like a CT scan of your abdomen may be necessary.

## 2022-02-02 NOTE — ED Triage Notes (Signed)
Patient here with complaint of abdominal pain that started two weeks ago, states a few weeks ago he felt similar pain and tested positive for gonorrhea. Patient was treated for gonorrhea and the pain resolved until two weeks ago after resuming sexual activity with his partner. Patient denies penile discharge.

## 2022-02-03 LAB — GC/CHLAMYDIA PROBE AMP (~~LOC~~) NOT AT ARMC
Chlamydia: NEGATIVE
Comment: NEGATIVE
Comment: NORMAL
Neisseria Gonorrhea: NEGATIVE

## 2022-11-29 ENCOUNTER — Other Ambulatory Visit: Payer: Self-pay

## 2022-11-29 ENCOUNTER — Encounter: Payer: Self-pay | Admitting: Emergency Medicine

## 2022-11-29 ENCOUNTER — Ambulatory Visit (INDEPENDENT_AMBULATORY_CARE_PROVIDER_SITE_OTHER): Payer: MEDICAID

## 2022-11-29 ENCOUNTER — Ambulatory Visit
Admission: EM | Admit: 2022-11-29 | Discharge: 2022-11-29 | Disposition: A | Discharge status: Home / Self Care | Payer: MEDICAID | Attending: Internal Medicine | Admitting: Internal Medicine

## 2022-11-29 ENCOUNTER — Ambulatory Visit (HOSPITAL_COMMUNITY): Payer: Self-pay

## 2022-11-29 DIAGNOSIS — Z113 Encounter for screening for infections with a predominantly sexual mode of transmission: Secondary | ICD-10-CM | POA: Diagnosis not present

## 2022-11-29 DIAGNOSIS — N3001 Acute cystitis with hematuria: Secondary | ICD-10-CM | POA: Diagnosis not present

## 2022-11-29 DIAGNOSIS — R109 Unspecified abdominal pain: Secondary | ICD-10-CM

## 2022-11-29 DIAGNOSIS — R300 Dysuria: Secondary | ICD-10-CM | POA: Insufficient documentation

## 2022-11-29 LAB — POCT URINALYSIS DIP (MANUAL ENTRY)
Bilirubin, UA: NEGATIVE
Glucose, UA: NEGATIVE mg/dL
Ketones, POC UA: NEGATIVE mg/dL
Nitrite, UA: NEGATIVE
Protein Ur, POC: 30 mg/dL — AB
Spec Grav, UA: 1.02 (ref 1.010–1.025)
Urobilinogen, UA: 0.2 E.U./dL
pH, UA: 6 (ref 5.0–8.0)

## 2022-11-29 MED ORDER — SULFAMETHOXAZOLE-TRIMETHOPRIM 800-160 MG PO TABS: 1.0000 | ORAL_TABLET | Freq: Two times a day (BID) | ORAL | 0 refills | Status: DC

## 2022-11-29 NOTE — Discharge Instructions (Addendum)
I have prescribed an antibiotic given concern for UTI.  Your penile swab and urine culture pending.  We will call if they are abnormal in a few days.  Your x-ray is pending as well.  I will call you today with results.  Please refrain from sexual activity until test results and treatment are complete.  Follow-up with any worsening or persistent symptoms.

## 2022-11-29 NOTE — ED Provider Notes (Signed)
[facility]    CSN: [id number] Arrival date & time: 11/29/22  1255      History   Chief Complaint Chief Complaint  Patient presents with   Dysuria    HPI Dale Cox is a 27 y.o. male.   Patient presents today with dysuria, urinary frequency, mild pink-tinged urine concerning for hematuria that started about 4 days ago.  Patient also reports some very mild right lower back pain.  Denies any fever, chills, nausea, vomiting.  Denies history of kidney stone or any injury to the back.  Denies any obvious penile discharge or testicular pain or swelling.  Denies exposure to STD but patient has had unprotected intercourse prior to symptoms starting.   Dysuria   Past Medical History:  Diagnosis Date   Asthma     There are no problems to display for this patient.   History reviewed. No pertinent surgical history.     Home Medications    Prior to Admission medications   Medication Sig Start Date End Date Taking? Authorizing Provider  sulfamethoxazole-trimethoprim (BACTRIM DS) 800-160 MG tablet Take 1 tablet by mouth 2 (two) times daily for 7 days. 11/29/22 12/06/22 Yes Daquon Greenleaf, Acie Fredrickson, FNP  albuterol (PROVENTIL HFA;VENTOLIN HFA) 108 (90 BASE) MCG/ACT inhaler Inhale 2 puffs into the lungs every 4 (four) hours as needed for wheezing or shortness of breath. 05/04/15   Ward, Layla Maw, DO  azithromycin (ZITHROMAX) 250 MG tablet Take 1 tablet (250 mg total) by mouth daily. Patient not taking: Reported on 11/29/2022 06/26/16   Tomasita Crumble, MD  benzonatate (TESSALON) 100 MG capsule Take 1 capsule (100 mg total) by mouth 2 (two) times daily as needed for cough. 09/09/16   Trixie Dredge, PA-C  guaiFENesin-dextromethorphan (ROBITUSSIN DM) 100-10 MG/5ML syrup Take 5 mLs by mouth every 4 (four) hours as needed for cough (and congestion). 09/09/16   Trixie Dredge, PA-C  ibuprofen (ADVIL,MOTRIN) 800 MG tablet Take 1 tablet (800 mg total) by mouth every 8 (eight) hours as needed for mild  pain or moderate pain. 09/09/16   Trixie Dredge, PA-C  Multiple Vitamin (MULTIVITAMIN WITH MINERALS) TABS tablet Take 1 tablet by mouth daily.    [provider]    Family History History reviewed. No pertinent family history.  Social History Social History   Tobacco Use   Smoking status: Some Days   Smokeless tobacco: Never  Substance Use Topics   Alcohol use: Yes   Drug use: Yes    Types: Marijuana     Allergies   Patient has no known allergies.   Review of Systems Review of Systems Per HPI  Physical Exam Triage Vital Signs ED Triage Vitals  Encounter Vitals Group     BP 11/29/22 1355 120/77     Systolic BP Percentile --      Diastolic BP Percentile --      Pulse Rate 11/29/22 1355 64     Resp 11/29/22 1355 18     Temp 11/29/22 1355 97.9 F (36.6 C)     Temp Source 11/29/22 1355 Oral     SpO2 11/29/22 1355 98 %     Weight --      Height --      Head Circumference --      Peak Flow --      Pain Score 11/29/22 1356 3     Pain Loc --      Pain Education --      Exclude from Growth Chart --  No data found.  Updated Vital Signs BP 120/77 (BP Location: Right Arm)   Pulse 64   Temp 97.9 F (36.6 C) (Oral)   Resp 18   SpO2 98%   Visual Acuity Right Eye Distance:   Left Eye Distance:   Bilateral Distance:    Right Eye Near:   Left Eye Near:    Bilateral Near:     Physical Exam Constitutional:      General: He is not in acute distress.    Appearance: Normal appearance. He is not toxic-appearing or diaphoretic.  HENT:     Head: Normocephalic and atraumatic.  Eyes:     Extraocular Movements: Extraocular movements intact.     Conjunctiva/sclera: Conjunctivae normal.  Cardiovascular:     Rate and Rhythm: Normal rate and regular rhythm.     Pulses: Normal pulses.     Heart sounds: Normal heart sounds.  Pulmonary:     Effort: Pulmonary effort is normal. No respiratory distress.     Breath sounds: Normal breath sounds. No stridor. No  wheezing, rhonchi or rales.  Musculoskeletal:     Comments: Very mild tenderness to palpation to right flank.  Neurological:     General: No focal deficit present.     Mental Status: He is alert and oriented to person, place, and time. Mental status is at baseline.  Psychiatric:        Mood and Affect: Mood normal.        Behavior: Behavior normal.        Thought Content: Thought content normal.        Judgment: Judgment normal.      UC Treatments / Results  Labs (all labs ordered are listed, but only abnormal results are displayed) Labs Reviewed  POCT URINALYSIS DIP (MANUAL ENTRY) - Abnormal; Notable for the following components:      Result Value   Clarity, UA cloudy (*)    Blood, UA moderate (*)    Protein Ur, POC =30 (*)    Leukocytes, UA Large (3+) (*)    All other components within normal limits  URINE CULTURE  CYTOLOGY, (ORAL, ANAL, URETHRAL) ANCILLARY ONLY    EKG   Radiology DG Abdomen 1 View  Result Date: 11/29/2022 CLINICAL DATA:  Flank pain, dysuria EXAM: ABDOMEN - 1 VIEW COMPARISON:  None Available. FINDINGS: Bowel gas pattern is nonspecific. Small calcification in left side of pelvis may be vascular. Less likely possibility would be a ureteral calculus. No abnormal calcifications are seen in abdomen. Kidneys are partly obscured by bowel contents. Visualized bony structures are unremarkable. IMPRESSION: Nonspecific bowel gas pattern. There is small 4 mm calcific density in left side of pelvis which may suggest phlebolith. If there is clinical suspicion for left ureteral calculus, follow-up CT may be considered. Electronically Signed   By: Ernie Avena M.D.   On: 11/29/2022 15:03    Procedures Procedures (including critical care time)  Medications Ordered in UC Medications - No data to display  Initial Impression / Assessment and Plan / UC Course  I have reviewed the triage vital signs and the nursing notes.  Pertinent labs & imaging results that were  available during my care of the patient were reviewed by me and considered in my medical decision making (see chart for details).     Patient's UA shows a large amount of white blood cells which is concerning for UTI.  Although, discussed with patient given his age there is concern for STD related symptoms as  well.  Therefore, will send urine culture and cytology swab off to confirm exact etiology.  In the meantime, will prescribe Bactrim to treat UTI while awaiting results given patient is having some flank pain.  KUB performed to assess for any associated kidney stone.  Discussed with patient that KUB is not the best image for kidney stone but do not have CT imaging in urgent care.  He voiced understanding of this.  This was unremarkable for any obvious kidney stone notable.  Called patient to discuss x-ray results and he voiced understanding with all questions answered.  Advised strict return and ER precautions.  Patient verbalized understanding and was agreeable with plan. Final Clinical Impressions(s) / UC Diagnoses   Final diagnoses:  Dysuria  Screening examination for venereal disease  Flank pain  Acute cystitis with hematuria     Discharge Instructions      I have prescribed an antibiotic given concern for UTI.  Your penile swab and urine culture pending.  We will call if they are abnormal in a few days.  Your x-ray is pending as well.  I will call you today with results.  Please refrain from sexual activity until test results and treatment are complete.  Follow-up with any worsening or persistent symptoms.    ED Prescriptions     Medication Sig Dispense Auth. Provider   sulfamethoxazole-trimethoprim (BACTRIM DS) 800-160 MG tablet Take 1 tablet by mouth 2 (two) times daily for 7 days. 14 tablet [place], Acie Fredrickson, [place]      PDMP not reviewed this encounter.   Gustavus Bryant, [place] 11/29/22 [phone]

## 2022-11-29 NOTE — ED Triage Notes (Signed)
Pt here for dysuria with frequency and some blood noted x 4 days; pt sts pain in back today

## 2022-11-30 LAB — URINE CULTURE

## 2022-11-30 LAB — CYTOLOGY, (ORAL, ANAL, URETHRAL) ANCILLARY ONLY
Chlamydia: NEGATIVE
Comment: NEGATIVE
Comment: NEGATIVE
Comment: NORMAL
Neisseria Gonorrhea: NEGATIVE
Trichomonas: NEGATIVE

## 2022-12-01 ENCOUNTER — Telehealth: Payer: Self-pay | Admitting: Emergency Medicine

## 2022-12-01 LAB — URINE CULTURE: Culture: >=100000 — AB

## 2022-12-01 MED ORDER — NITROFURANTOIN MONOHYD MACRO 100 MG PO CAPS: 100.0000 mg | ORAL_CAPSULE | Freq: Two times a day (BID) | ORAL | 0 refills | Status: AC

## 2023-05-05 ENCOUNTER — Emergency Department (HOSPITAL_COMMUNITY)
Admission: EM | Admit: 2023-05-05 | Discharge: 2023-05-05 | Disposition: A | Discharge status: Home / Self Care | Payer: Self-pay | Attending: Emergency Medicine | Admitting: Emergency Medicine

## 2023-05-05 ENCOUNTER — Other Ambulatory Visit: Payer: Self-pay

## 2023-05-05 ENCOUNTER — Emergency Department (HOSPITAL_COMMUNITY): Payer: Self-pay

## 2023-05-05 ENCOUNTER — Encounter (HOSPITAL_COMMUNITY): Payer: Self-pay | Admitting: Emergency Medicine

## 2023-05-05 DIAGNOSIS — Z20822 Contact with and (suspected) exposure to covid-19: Secondary | ICD-10-CM | POA: Diagnosis not present

## 2023-05-05 DIAGNOSIS — J45909 Unspecified asthma, uncomplicated: Secondary | ICD-10-CM | POA: Diagnosis not present

## 2023-05-05 DIAGNOSIS — R079 Chest pain, unspecified: Secondary | ICD-10-CM | POA: Diagnosis present

## 2023-05-05 DIAGNOSIS — R0602 Shortness of breath: Secondary | ICD-10-CM | POA: Diagnosis not present

## 2023-05-05 DIAGNOSIS — R059 Cough, unspecified: Secondary | ICD-10-CM | POA: Insufficient documentation

## 2023-05-05 LAB — RESP PANEL BY RT-PCR (RSV, FLU A&B, COVID)  RVPGX2
Influenza A by PCR: NEGATIVE
Influenza B by PCR: NEGATIVE
Resp Syncytial Virus by PCR: NEGATIVE
SARS Coronavirus 2 by RT PCR: NEGATIVE

## 2023-05-05 LAB — URINALYSIS, W/ REFLEX TO CULTURE (INFECTION SUSPECTED)
Bacteria, UA: NONE SEEN
Bilirubin Urine: NEGATIVE
Glucose, UA: NEGATIVE mg/dL
Hgb urine dipstick: NEGATIVE
Ketones, ur: NEGATIVE mg/dL
Nitrite: NEGATIVE
Protein, ur: NEGATIVE mg/dL
Specific Gravity, Urine: 1.009 (ref 1.005–1.030)
pH: 5 (ref 5.0–8.0)

## 2023-05-05 LAB — BASIC METABOLIC PANEL
Anion gap: 12 (ref 5–15)
BUN: 12 mg/dL (ref 6–20)
CO2: 21 mmol/L — ABNORMAL LOW (ref 22–32)
Calcium: 8.8 mg/dL — ABNORMAL LOW (ref 8.9–10.3)
Chloride: 101 mmol/L (ref 98–111)
Creatinine, Ser: 1.12 mg/dL (ref 0.61–1.24)
GFR, Estimated: >60 mL/min (ref >60)
Glucose, Bld: 115 mg/dL — ABNORMAL HIGH (ref 70–99)
Potassium: 3.4 mmol/L — ABNORMAL LOW (ref 3.5–5.1)
Sodium: 134 mmol/L — ABNORMAL LOW (ref 135–145)

## 2023-05-05 LAB — CBC
HCT: 44.7 % (ref 39.0–52.0)
Hemoglobin: 15.5 g/dL (ref 13.0–17.0)
MCH: 30.9 pg (ref 26.0–34.0)
MCHC: 34.7 g/dL (ref 30.0–36.0)
MCV: 89 fL (ref 80.0–100.0)
Platelets: 261 10*3/uL (ref 150–400)
RBC: 5.02 MIL/uL (ref 4.22–5.81)
RDW: 12.2 % (ref 11.5–15.5)
WBC: 8.1 10*3/uL (ref 4.0–10.5)
nRBC: 0 % (ref 0.0–0.2)

## 2023-05-05 LAB — TROPONIN I (HIGH SENSITIVITY)
Troponin I (High Sensitivity): 6 ng/L (ref <18)
Troponin I (High Sensitivity): 6 ng/L (ref <18)

## 2023-05-05 LAB — RAPID HIV SCREEN (HIV 1/2 AB+AG)
HIV 1/2 Antibodies: NONREACTIVE
HIV-1 P24 Antigen - HIV24: NONREACTIVE

## 2023-05-05 MED ORDER — IBUPROFEN 200 MG PO TABS
600.0000 mg | ORAL_TABLET | Freq: Once | ORAL | Status: AC
  Administered 2023-05-05: 600 mg via ORAL
  Filled 2023-05-05: qty 1

## 2023-05-05 MED ORDER — ACETAMINOPHEN 500 MG PO TABS
1000.0000 mg | ORAL_TABLET | Freq: Once | ORAL | Status: AC
  Administered 2023-05-05: 1000 mg via ORAL
  Filled 2023-05-05: qty 2

## 2023-05-05 MED ORDER — ALBUTEROL SULFATE HFA 108 (90 BASE) MCG/ACT IN AERS
2.0000 | INHALATION_SPRAY | Freq: Once | RESPIRATORY_TRACT | Status: AC
  Administered 2023-05-05: 2 via RESPIRATORY_TRACT
  Filled 2023-05-05: qty 6.7

## 2023-05-05 MED ORDER — AZITHROMYCIN 250 MG PO TABS: 250.0000 mg | ORAL_TABLET | Freq: Every day | ORAL | 0 refills | Status: AC

## 2023-05-05 MED ORDER — SODIUM CHLORIDE 0.9 % IV BOLUS
1000.0000 mL | Freq: Once | INTRAVENOUS | Status: AC
  Administered 2023-05-05: 1000 mL via INTRAVENOUS

## 2023-05-05 NOTE — Discharge Instructions (Addendum)
You have been seen here in the emergency department for fever and cough. We have obtained a full history, performed a physical exam, in addition to other diagnostic tests and treatments. Right now, we feel that you are safe for discharge from a medical perspective, and do not have an acute life threatening illness.   To do: 1.) Take all medications as prescribed.   2.) If anything changes, or you develop fevers, chills, inability to eat or drink, severe pain, new symptoms, return of symptoms, worsening of symptoms, or any other concerns, please call 911 or come back to the emergency department as soon as possible.   3.) Please make an appointment with your primary care doctor for a follow-up visit after being seen here in the emergency department.   4.)  You need to establish care with a primary care provider, and make sure your asthma is well-controlled and someone is following this.  We are going to give you a short course of azithromycin which is an antibiotic to treat for pneumonia even though we do not standing on chest x-ray.  Please take the medication in full and use albuterol as needed every 4-6 hours.  Do not use it if you do not feel you need it.  Please come back if your symptoms worsen.  Thank you for allowing me to take care of you today. We hope that you feel better soon.

## 2023-05-05 NOTE — ED Triage Notes (Signed)
Pt c/o URI symptoms including cough, chills, malaise, DOE for one week. Pt also c/o mid sternal chest pressure that is constant. Pt states he has known sick contacts with family who is pos for flu.

## 2023-05-05 NOTE — ED Provider Notes (Signed)
[facility] AT [facility] Provider Note  HPI   Dale Cox is a 27 y.o. male patient with a PMHx of asthma who is here today with concern for cough shortness of breath.  Patient states that he may have been exposed to the flu last week, has had for the past week, cough, fevers chills, body cramps, and some mild shortness of breath.  He did endorse some chest pain with cough.  He is here today mostly with persistence of symptoms  He also endorses some intermittent penile white vaginal discharge over the past month or 2, he does endorse a history of urinary tract infection in the past.    ROS Negative except as per HPI   Medical Decision Making   Upon presentation, the patient is febrile and tachycardic 101.8 120 heart rate, blood pressure in the 140s 130s.  He overall appears well on exam and I do not hear any focal wheezing or crackles.  Prior to me seeing the patient he already had large medical workup done, ECG shows sinus tachycardia but no signs of arrhythmia, his chest x-ray is clear as I would like to rule out a pneumonia and he does not have that.  His labs are largely unremarkable has had some very narrowed electrolyte derangement sodium 134 potassium 3.4 calcium 8.8.  His hematologic panel is within normal limits.  And of note his COVID and flu is negative.  There is a troponin on him, and there is a second 1 in process first 1 is negative, I think he is most have having costochondritis in the setting of coughing and that is causing his chest pain.  I will expand my workup he is already received a liter of fluid, he does have a history of urinary tract infection and is febrile tachycardic so we will check that as well, he also has had male sexual partners, will get a RPR and HIV as well as this could represent early acute onset age HIV.  Will suggest regarding chlamydia.  I will probably not empirically treat him as he has not had any penile discharge  in a week, he does not any disseminated rash or joint pain, so doubt this is disseminated gonococcal infection.  Will follow-up the urine, treat accordingly, and I am going to consider be giving this patient a Zithromax regardless for a radiograph negative pneumonia  Patient's urine is negative for signs infection other than trace leukocyte esterase which think is nonspecific.  His HIV test is fortunately negative.  There is gonorrhea and chlamydia and RPR pending which the patient will follow-up on MyChart.  We did give him some ibuprofen, push p.o. fluids, and also gave him an albuterol inhaler which she will go home with.  He is going to establish care with a primary care doctor so to ensure that his allergist is well-controlled to avoid future episodes like this.  We discharged the patient at this time with albuterol and will give a short course of azithromycin for concern for occult pneumonia.  He will come back if any acutely changes also could be a negative flu that is not him back on the flu test.  At this time, I feel that the patient is medically cleared for discharge and have discussed this with my attending who agrees.  I discussed with the patient and/or family my overall assessment, including my physical exam, labs, imaging, other diagnostic tests, and therapeutics given.  All questions answered and understanding is expressed.  I have instructed to call PCP to establish an outpatient appointment after this ED visit, and necessary specialty follow up if needed. I gave strict return precautions to come back to the ED including fevers, chills, severe pain, worsening of symptoms, return of symptoms, new and concerning symptoms, inability to tolerate p.o. intake, among others. I specifically stated to return if symptoms worsen or return   1. Chest pain, unspecified type     @DISPOSITION @  Rx / DC Orders ED Discharge Orders     None        Past Medical History:  Diagnosis Date   Asthma     History reviewed. No pertinent surgical history. History reviewed. No pertinent family history. Social History   Socioeconomic History   Marital status: Single    Spouse name: Not on file   Number of children: Not on file   Years of education: Not on file   Highest education level: Not on file  Occupational History   Not on file  Tobacco Use   Smoking status: Some Days   Smokeless tobacco: Never  Substance and Sexual Activity   Alcohol use: Yes   Drug use: Yes    Types: Marijuana   Sexual activity: Yes    Birth control/protection: None  Other Topics Concern   Not on file  Social History Narrative   Not on file   Social Drivers of Health   Financial Resource Strain: Not on file  Food Insecurity: Not on file  Transportation Needs: Not on file  Physical Activity: Not on file  Stress: Not on file  Social Connections: Unknown (09/21/2021)   Received from [facility]   Social Network    Social Network: Not on file  Intimate Partner Violence: Unknown (08/13/2021)   Received from [facility]   HITS    Physically Hurt: Not on file    Insult or Talk Down To: Not on file    Threaten Physical Harm: Not on file    Scream or Curse: Not on file     Physical Exam   Vitals:   05/05/23 1445 05/05/23 1526 05/05/23 1530 05/05/23 1600  BP: 103/64   114/80  Pulse: (!) 118 (!) 103  100  Resp: (!) 26   15  Temp:  (!) 100.5 F (38.1 C) 99.6 F (37.6 C)   TempSrc:  Oral Oral   SpO2: 100%   100%    Physical Exam Vitals and nursing note reviewed.  Constitutional:      General: He is not in acute distress.    Appearance: Normal appearance. He is well-developed. He is not ill-appearing or toxic-appearing.  HENT:     Head: Normocephalic and atraumatic.     Right Ear: External ear normal.     Left Ear: External ear normal.     Nose: Nose normal.     Mouth/Throat:     Mouth: Mucous membranes are moist.  Eyes:     Extraocular Movements:  Extraocular movements intact.     Pupils: Pupils are equal, round, and reactive to light.  Cardiovascular:     Rate and Rhythm: Normal rate.     Pulses: Normal pulses.  Pulmonary:     Effort: Pulmonary effort is normal. No respiratory distress.     Breath sounds: Normal breath sounds. No stridor. No wheezing, rhonchi or rales.  Abdominal:     Palpations: Abdomen is soft.     Tenderness: There is no abdominal tenderness. There is  no right CVA tenderness or left CVA tenderness.  Musculoskeletal:        General: Normal range of motion.     Cervical back: Normal range of motion and neck supple.  Skin:    General: Skin is warm and dry.     Capillary Refill: Capillary refill takes less than 2 seconds.  Neurological:     General: No focal deficit present.     Mental Status: He is alert and oriented to person, place, and time. Mental status is at baseline.  Psychiatric:        Mood and Affect: Mood normal.    Procedures   If procedures were preformed on this patient, they are listed below:  Procedures  The patient was seen, evaluated, and treated in conjunction with the attending physician, who voiced agreement in the care provided.  Note generated using Dragon voice dictation software and may contain dictation errors. Please contact me for any clarification or with any questions.   Electronically signed by:  Osvaldo Shipper, M.D. (PGY-2)    Gunnar Bulla, MD 05/05/23 1701    Pricilla Loveless, MD 05/06/23 [phone]

## 2023-05-06 LAB — RPR: RPR Ser Ql: NONREACTIVE

## 2023-05-08 LAB — GC/CHLAMYDIA PROBE AMP (~~LOC~~) NOT AT ARMC
Chlamydia: NEGATIVE
Comment: NEGATIVE
Comment: NORMAL
Neisseria Gonorrhea: NEGATIVE
